# Patient Record
Sex: Male | Born: 1950 | Race: White | Hispanic: No | Marital: Single | Smoking: Former smoker
Health system: Southern US, Community
[De-identification: ages and names within clinical notes are randomized; demographics above are authoritative.]

## PROBLEM LIST (undated history)

## (undated) DIAGNOSIS — F32A Depression, unspecified: Secondary | ICD-10-CM

## (undated) DIAGNOSIS — F329 Major depressive disorder, single episode, unspecified: Secondary | ICD-10-CM

## (undated) DIAGNOSIS — M1990 Unspecified osteoarthritis, unspecified site: Secondary | ICD-10-CM

## (undated) DIAGNOSIS — R011 Cardiac murmur, unspecified: Secondary | ICD-10-CM

## (undated) HISTORY — DX: Cardiac murmur, unspecified: R01.1

## (undated) HISTORY — PX: NO PAST SURGERIES: SHX2092

---
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
## 2018-02-03 ENCOUNTER — Encounter: Payer: Self-pay | Admitting: *Deleted

## 2018-10-17 ENCOUNTER — Encounter
Admission: RE | Admit: 2018-10-17 | Discharge: 2018-10-17 | Disposition: A | Discharge status: Home / Self Care | Payer: Medicare Other | Source: Ambulatory Visit | Attending: Orthopedic Surgery | Admitting: Orthopedic Surgery

## 2018-10-17 ENCOUNTER — Other Ambulatory Visit: Payer: Self-pay

## 2018-10-17 DIAGNOSIS — Z1159 Encounter for screening for other viral diseases: Secondary | ICD-10-CM | POA: Diagnosis not present

## 2018-10-17 DIAGNOSIS — Z01818 Encounter for other preprocedural examination: Secondary | ICD-10-CM | POA: Insufficient documentation

## 2018-10-17 DIAGNOSIS — Z01810 Encounter for preprocedural cardiovascular examination: Secondary | ICD-10-CM | POA: Diagnosis not present

## 2018-10-17 HISTORY — DX: Unspecified osteoarthritis, unspecified site: M19.90

## 2018-10-17 HISTORY — DX: Depression, unspecified: F32.A

## 2018-10-17 LAB — TYPE AND SCREEN
ABO/RH(D): A NEG
Antibody Screen: NEGATIVE

## 2018-10-17 LAB — URINALYSIS, ROUTINE W REFLEX MICROSCOPIC
Bilirubin Urine: NEGATIVE
Glucose, UA: NEGATIVE mg/dL
Hgb urine dipstick: NEGATIVE
Ketones, ur: NEGATIVE mg/dL
Nitrite: POSITIVE — AB
Protein, ur: NEGATIVE mg/dL
Specific Gravity, Urine: 1.027 (ref 1.005–1.030)
Squamous Epithelial / HPF: NONE SEEN (ref 0–5)
pH: 5 (ref 5.0–8.0)

## 2018-10-17 LAB — BASIC METABOLIC PANEL
Anion gap: 8 (ref 5–15)
BUN: 21 mg/dL (ref 8–23)
CO2: 23 mmol/L (ref 22–32)
Calcium: 9.3 mg/dL (ref 8.9–10.3)
Chloride: 107 mmol/L (ref 98–111)
Creatinine, Ser: 0.74 mg/dL (ref 0.61–1.24)
GFR calc Af Amer: >60 mL/min (ref >60)
GFR calc non Af Amer: >60 mL/min (ref >60)
Glucose, Bld: 108 mg/dL — ABNORMAL HIGH (ref 70–99)
Potassium: 4 mmol/L (ref 3.5–5.1)
Sodium: 138 mmol/L (ref 135–145)

## 2018-10-17 LAB — CBC
HCT: 46.9 % (ref 39.0–52.0)
Hemoglobin: 15.6 g/dL (ref 13.0–17.0)
MCH: 31 pg (ref 26.0–34.0)
MCHC: 33.3 g/dL (ref 30.0–36.0)
MCV: 93.2 fL (ref 80.0–100.0)
Platelets: 204 10*3/uL (ref 150–400)
RBC: 5.03 MIL/uL (ref 4.22–5.81)
RDW: 12 % (ref 11.5–15.5)
WBC: 5.9 10*3/uL (ref 4.0–10.5)
nRBC: 0 % (ref 0.0–0.2)

## 2018-10-17 LAB — SURGICAL PCR SCREEN
MRSA, PCR: NEGATIVE
Staphylococcus aureus: NEGATIVE

## 2018-10-17 LAB — PROTIME-INR
INR: 1.1 (ref 0.8–1.2)
Prothrombin Time: 14 seconds (ref 11.4–15.2)

## 2018-10-17 LAB — SEDIMENTATION RATE: Sed Rate: 2 mm/hr (ref 0–20)

## 2018-10-17 LAB — APTT: aPTT: 31 seconds (ref 24–36)

## 2018-10-17 NOTE — Patient Instructions (Signed)
Your procedure is scheduled ZO:XWRUEon:Thurs 6/11 Report to Day Surgery. To find out your arrival time please call [phone] between 1PM - 3PM on Wed. 6/10.  Remember: Instructions that are not followed completely may result in serious medical risk,  up to and including death, or upon the discretion of your surgeon and anesthesiologist your  surgery may need to be rescheduled.     _X__ 1. Do not eat food after midnight the night before your procedure.                 No gum chewing or hard candies. You may drink clear liquids up to 2 hours                 before you are scheduled to arrive for your surgery- DO not drink clear                 liquids within 2 hours of the start of your surgery.                 Clear Liquids include:  water, apple juice without pulp, clear carbohydrate                 drink such as Clearfast of Gatorade, Black Coffee or Tea (Do not add                 anything to coffee or tea).  __X__2.  On the morning of surgery brush your teeth with toothpaste and water, you                may rinse your mouth with mouthwash if you wish.  Do not swallow any toothpaste of mouthwash.     _X__ 3.  No Alcohol for 24 hours before or after surgery.   ___ 4.  Do Not Smoke or use e-cigarettes For 24 Hours Prior to Your Surgery.                 Do not use any chewable tobacco products for at least 6 hours prior to                 surgery.  ____  5.  Bring all medications with you on the day of surgery if instructed.   _x___  6.  Notify your doctor if there is any change in your medical condition      (cold, fever, infections).     Do not wear jewelry, make-up, hairpins, clips or nail polish. Do not wear lotions, powders, or perfumes. You may wear deodorant. Do not shave 48 hours prior to surgery. Men may shave face and neck. Do not bring valuables to the hospital.    [facility] not [facility] any belongings or valuables.  Contacts, dentures  or bridgework may not be worn into surgery. Leave your suitcase in the car. After surgery it may be brought to your room. For patients admitted to the hospital, discharge time is determined by your treatment team.   Patients discharged the day of surgery will not be allowed to drive home.   Please read over the following fact sheets that you were given:    __x__ Take these medicines the morning of surgery with A SIP OF WATER:    1. none  2.   3.   4.  5.  6.  ____ Fleet Enema (as directed)   _x___ Use CHG Soap as directed  ____ Use inhalers on the day of surgery  ____ Stop metformin 2 days prior to surgery    ____ Take 1/2 of usual insulin dose the night before surgery. No insulin the morning          of surgery.   ____ Stop Coumadin/Plavix/aspirin on   __x__ Stop Anti-inflammatories today Ibuprofen   May take Tylenol   ____ Stop supplements until after surgery.    ____ Bring C-Pap to the hospital.

## 2018-10-18 LAB — NOVEL CORONAVIRUS, NAA (HOSP ORDER, SEND-OUT TO REF LAB; TAT 18-24 HRS): SARS-CoV-2, NAA: NOT DETECTED

## 2018-10-19 LAB — URINE CULTURE
Culture: >=100000 — AB
Special Requests: NORMAL

## 2018-10-19 MED ORDER — CEFAZOLIN SODIUM-DEXTROSE 2-4 GM/100ML-% IV SOLN: 2.0000 g | Freq: Once | INTRAVENOUS | Status: DC

## 2018-10-20 ENCOUNTER — Inpatient Hospital Stay: Payer: Medicare Other | Admitting: Certified Registered"

## 2018-10-20 ENCOUNTER — Other Ambulatory Visit: Payer: Self-pay

## 2018-10-20 ENCOUNTER — Encounter: Payer: Self-pay | Admitting: *Deleted

## 2018-10-20 ENCOUNTER — Ambulatory Visit
Admission: RE | Admit: 2018-10-20 | Discharge: 2018-10-20 | Disposition: A | Discharge status: Home / Self Care | Payer: Medicare Other | Source: Ambulatory Visit | Attending: Orthopedic Surgery | Admitting: Orthopedic Surgery

## 2018-10-20 DIAGNOSIS — M1612 Unilateral primary osteoarthritis, left hip: Secondary | ICD-10-CM | POA: Diagnosis not present

## 2018-10-20 DIAGNOSIS — Z419 Encounter for procedure for purposes other than remedying health state, unspecified: Secondary | ICD-10-CM

## 2018-10-20 DIAGNOSIS — Z87891 Personal history of nicotine dependence: Secondary | ICD-10-CM | POA: Insufficient documentation

## 2018-10-20 DIAGNOSIS — Z539 Procedure and treatment not carried out, unspecified reason: Secondary | ICD-10-CM | POA: Diagnosis not present

## 2018-10-20 LAB — ABO/RH: ABO/RH(D): A NEG

## 2018-10-20 MED ORDER — LIDOCAINE HCL (PF) 2 % IJ SOLN
INTRAMUSCULAR | Status: AC
  Filled 2018-10-20: qty 10

## 2018-10-20 MED ORDER — MIDAZOLAM HCL 2 MG/2ML IJ SOLN
INTRAMUSCULAR | Status: AC
  Filled 2018-10-20: qty 2

## 2018-10-20 MED ORDER — FAMOTIDINE 20 MG PO TABS
ORAL_TABLET | ORAL | Status: AC
  Filled 2018-10-20: qty 1

## 2018-10-20 MED ORDER — PROPOFOL 10 MG/ML IV BOLUS
INTRAVENOUS | Status: AC
  Filled 2018-10-20: qty 20

## 2018-10-20 MED ORDER — GLYCOPYRROLATE 0.2 MG/ML IJ SOLN
INTRAMUSCULAR | Status: AC
  Filled 2018-10-20: qty 1

## 2018-10-20 MED ORDER — FENTANYL CITRATE (PF) 100 MCG/2ML IJ SOLN
INTRAMUSCULAR | Status: AC
  Filled 2018-10-20: qty 2

## 2018-10-20 MED ORDER — CEFAZOLIN SODIUM-DEXTROSE 2-4 GM/100ML-% IV SOLN
INTRAVENOUS | Status: AC
  Filled 2018-10-20: qty 100

## 2018-10-20 MED ORDER — LACTATED RINGERS IV SOLN
INTRAVENOUS | Status: DC
  Administered 2018-10-20: 09:00:00 via INTRAVENOUS

## 2018-10-20 MED ORDER — FAMOTIDINE 20 MG PO TABS
20.0000 mg | ORAL_TABLET | Freq: Once | ORAL | Status: AC
  Administered 2018-10-20: 20 mg via ORAL

## 2018-10-20 NOTE — Anesthesia Preprocedure Evaluation (Addendum)
Anesthesia Evaluation  Patient identified by MRN, date of birth, ID band Patient awake    Reviewed: Allergy & Precautions, H&P , NPO status , Patient's Chart, lab work & pertinent test results  Airway        Dental   Pulmonary neg shortness of breath, neg COPD, neg recent URI, former smoker,           Cardiovascular (-) angina(-) Past MI and (-) Cardiac Stents negative cardio ROS  (-) dysrhythmias      Neuro/Psych PSYCHIATRIC DISORDERS Depression negative neurological ROS     GI/Hepatic negative GI ROS, Neg liver ROS,   Endo/Other  negative endocrine ROS  Renal/GU      Musculoskeletal   Abdominal   Peds  Hematology negative hematology ROS (+)   Anesthesia Other Findings Obese  Past Medical History: No date: Arthritis No date: Depression  Past Surgical History: No date: NO PAST SURGERIES     Reproductive/Obstetrics negative OB ROS                           Anesthesia Physical Anesthesia Plan  ASA: II  Anesthesia Plan: Spinal   Post-op Pain Management:    Induction:   PONV Risk Score and Plan: Propofol infusion  Airway Management Planned: Simple Face Mask and Natural Airway  Additional Equipment:   Intra-op Plan:   Post-operative Plan:   Informed Consent: I have reviewed the patients History and Physical, chart, labs and discussed the procedure including the risks, benefits and alternatives for the proposed anesthesia with the patient or authorized representative who has indicated his/her understanding and acceptance.     Dental Advisory Given  Plan Discussed with: Anesthesiologist and CRNA  Anesthesia Plan Comments:        Anesthesia Quick Evaluation

## 2018-10-20 NOTE — Progress Notes (Signed)
Dr. Rudene Christians at bedside to examine; Dr. Rudene Christians in agreement to cancel surgery d/t reddened area is at the site of incision.  Patient with verbal agreement to return for surgery at the selected date and time given to him from [facility] orthopedics.

## 2018-10-20 NOTE — OR Nursing (Signed)
Patient has red raised bump, appears to possibly be a bug bite, one inch in diameter.  OR nurse and Dr Rudene Christians notified. He will examine.

## 2018-10-24 ENCOUNTER — Other Ambulatory Visit
Admission: RE | Admit: 2018-10-24 | Discharge: 2018-10-24 | Disposition: A | Discharge status: Home / Self Care | Payer: Medicare Other | Source: Ambulatory Visit | Attending: Orthopedic Surgery | Admitting: Orthopedic Surgery

## 2018-10-24 DIAGNOSIS — Z1159 Encounter for screening for other viral diseases: Secondary | ICD-10-CM | POA: Insufficient documentation

## 2018-10-25 LAB — NOVEL CORONAVIRUS, NAA (HOSP ORDER, SEND-OUT TO REF LAB; TAT 18-24 HRS): SARS-CoV-2, NAA: NOT DETECTED

## 2018-10-27 ENCOUNTER — Encounter
Admission: RE | Disposition: A | Discharge status: Home Health | Payer: Self-pay | Source: Home / Self Care | Attending: Orthopedic Surgery

## 2018-10-27 ENCOUNTER — Inpatient Hospital Stay: Payer: Medicare Other | Admitting: Certified Registered"

## 2018-10-27 ENCOUNTER — Inpatient Hospital Stay: Payer: Medicare Other

## 2018-10-27 ENCOUNTER — Inpatient Hospital Stay: Admit: 2018-10-27 | Payer: Medicare Other | Admitting: Orthopedic Surgery

## 2018-10-27 ENCOUNTER — Encounter: Payer: Self-pay | Admitting: *Deleted

## 2018-10-27 ENCOUNTER — Other Ambulatory Visit: Payer: Self-pay

## 2018-10-27 ENCOUNTER — Inpatient Hospital Stay
Admission: RE | Admit: 2018-10-27 | Discharge: 2018-10-29 | DRG: 470 | Disposition: A | Discharge status: Home Health | Payer: Medicare Other | Attending: Surgery | Admitting: Surgery

## 2018-10-27 DIAGNOSIS — Z87891 Personal history of nicotine dependence: Secondary | ICD-10-CM

## 2018-10-27 DIAGNOSIS — Z8249 Family history of ischemic heart disease and other diseases of the circulatory system: Secondary | ICD-10-CM | POA: Diagnosis not present

## 2018-10-27 DIAGNOSIS — Z6835 Body mass index (BMI) 35.0-35.9, adult: Secondary | ICD-10-CM | POA: Diagnosis not present

## 2018-10-27 DIAGNOSIS — R000 Tachycardia, unspecified: Secondary | ICD-10-CM | POA: Diagnosis not present

## 2018-10-27 DIAGNOSIS — Z8261 Family history of arthritis: Secondary | ICD-10-CM | POA: Diagnosis not present

## 2018-10-27 DIAGNOSIS — Z419 Encounter for procedure for purposes other than remedying health state, unspecified: Secondary | ICD-10-CM

## 2018-10-27 DIAGNOSIS — Z79899 Other long term (current) drug therapy: Secondary | ICD-10-CM

## 2018-10-27 DIAGNOSIS — M1612 Unilateral primary osteoarthritis, left hip: Secondary | ICD-10-CM | POA: Diagnosis present

## 2018-10-27 DIAGNOSIS — E669 Obesity, unspecified: Secondary | ICD-10-CM | POA: Diagnosis present

## 2018-10-27 DIAGNOSIS — Z96642 Presence of left artificial hip joint: Secondary | ICD-10-CM

## 2018-10-27 DIAGNOSIS — G8918 Other acute postprocedural pain: Secondary | ICD-10-CM

## 2018-10-27 HISTORY — PX: TOTAL HIP ARTHROPLASTY: SHX124

## 2018-10-27 LAB — CREATININE, SERUM
Creatinine, Ser: 0.8 mg/dL (ref 0.61–1.24)
GFR calc Af Amer: >60 mL/min (ref >60)
GFR calc non Af Amer: >60 mL/min (ref >60)

## 2018-10-27 LAB — CBC
HCT: 43.1 % (ref 39.0–52.0)
Hemoglobin: 14.2 g/dL (ref 13.0–17.0)
MCH: 31.4 pg (ref 26.0–34.0)
MCHC: 32.9 g/dL (ref 30.0–36.0)
MCV: 95.4 fL (ref 80.0–100.0)
Platelets: 183 10*3/uL (ref 150–400)
RBC: 4.52 MIL/uL (ref 4.22–5.81)
RDW: 11.9 % (ref 11.5–15.5)
WBC: 8.1 10*3/uL (ref 4.0–10.5)
nRBC: 0 % (ref 0.0–0.2)

## 2018-10-27 SURGERY — ARTHROPLASTY, HIP, TOTAL, ANTERIOR APPROACH
Anesthesia: Spinal | Laterality: Left

## 2018-10-27 SURGERY — ARTHROPLASTY, HIP, TOTAL,POSTERIOR APPROACH
Anesthesia: Spinal | Laterality: Left

## 2018-10-27 MED ORDER — MORPHINE SULFATE (PF) 2 MG/ML IV SOLN: 0.5000 mg | INTRAVENOUS | Status: DC | PRN

## 2018-10-27 MED ORDER — ALUM & MAG HYDROXIDE-SIMETH 200-200-20 MG/5ML PO SUSP: 30.0000 mL | ORAL | Status: DC | PRN

## 2018-10-27 MED ORDER — MIDAZOLAM HCL 5 MG/5ML IJ SOLN
INTRAMUSCULAR | Status: DC | PRN
  Administered 2018-10-27: 2 mg via INTRAVENOUS

## 2018-10-27 MED ORDER — NEOMYCIN-POLYMYXIN B GU 40-200000 IR SOLN
Status: DC | PRN
  Administered 2018-10-27: 4 mL

## 2018-10-27 MED ORDER — SODIUM CHLORIDE FLUSH 0.9 % IV SOLN
INTRAVENOUS | Status: AC
  Filled 2018-10-27: qty 60

## 2018-10-27 MED ORDER — METHOCARBAMOL 1000 MG/10ML IJ SOLN
500.0000 mg | Freq: Four times a day (QID) | INTRAVENOUS | Status: DC | PRN
  Filled 2018-10-27: qty 5

## 2018-10-27 MED ORDER — METOCLOPRAMIDE HCL 5 MG/ML IJ SOLN: 5.0000 mg | Freq: Three times a day (TID) | INTRAMUSCULAR | Status: DC | PRN

## 2018-10-27 MED ORDER — MENTHOL 3 MG MT LOZG: 1.0000 | LOZENGE | OROMUCOSAL | Status: DC | PRN

## 2018-10-27 MED ORDER — MAGNESIUM HYDROXIDE 400 MG/5ML PO SUSP: 30.0000 mL | Freq: Every day | ORAL | Status: DC | PRN

## 2018-10-27 MED ORDER — FENTANYL CITRATE (PF) 100 MCG/2ML IJ SOLN: 25.0000 ug | INTRAMUSCULAR | Status: DC | PRN

## 2018-10-27 MED ORDER — SODIUM CHLORIDE 0.9 % IV SOLN
INTRAVENOUS | Status: DC | PRN
  Administered 2018-10-27: 09:00:00 60 mL

## 2018-10-27 MED ORDER — CEFAZOLIN SODIUM-DEXTROSE 2-4 GM/100ML-% IV SOLN
INTRAVENOUS | Status: AC
  Filled 2018-10-27: qty 100

## 2018-10-27 MED ORDER — FAMOTIDINE 20 MG PO TABS
20.0000 mg | ORAL_TABLET | Freq: Once | ORAL | Status: AC
  Administered 2018-10-27: 07:00:00 20 mg via ORAL

## 2018-10-27 MED ORDER — TRANEXAMIC ACID 1000 MG/10ML IV SOLN
INTRAVENOUS | Status: AC
  Filled 2018-10-27: qty 10

## 2018-10-27 MED ORDER — DOCUSATE SODIUM 100 MG PO CAPS
100.0000 mg | ORAL_CAPSULE | Freq: Two times a day (BID) | ORAL | Status: DC
  Administered 2018-10-27 – 2018-10-29 (×4): 100 mg via ORAL
  Filled 2018-10-27 (×4): qty 1

## 2018-10-27 MED ORDER — PROMETHAZINE HCL 25 MG/ML IJ SOLN: 6.2500 mg | INTRAMUSCULAR | Status: DC | PRN

## 2018-10-27 MED ORDER — FENTANYL CITRATE (PF) 100 MCG/2ML IJ SOLN
INTRAMUSCULAR | Status: AC
  Filled 2018-10-27: qty 2

## 2018-10-27 MED ORDER — CEFAZOLIN SODIUM-DEXTROSE 2-4 GM/100ML-% IV SOLN
2.0000 g | Freq: Four times a day (QID) | INTRAVENOUS | Status: AC
  Administered 2018-10-27 (×2): 2 g via INTRAVENOUS
  Filled 2018-10-27 (×2): qty 100

## 2018-10-27 MED ORDER — NEOMYCIN-POLYMYXIN B GU 40-200000 IR SOLN
Status: AC
  Filled 2018-10-27: qty 1

## 2018-10-27 MED ORDER — PANTOPRAZOLE SODIUM 40 MG PO TBEC
40.0000 mg | DELAYED_RELEASE_TABLET | Freq: Every day | ORAL | Status: DC
  Administered 2018-10-28 – 2018-10-29 (×2): 40 mg via ORAL
  Filled 2018-10-27 (×2): qty 1

## 2018-10-27 MED ORDER — FENTANYL CITRATE (PF) 100 MCG/2ML IJ SOLN
INTRAMUSCULAR | Status: DC | PRN
  Administered 2018-10-27 (×2): 50 ug via INTRAVENOUS

## 2018-10-27 MED ORDER — MAGNESIUM CITRATE PO SOLN: 1.0000 | Freq: Once | ORAL | Status: DC | PRN

## 2018-10-27 MED ORDER — METOCLOPRAMIDE HCL 10 MG PO TABS: 5.0000 mg | ORAL_TABLET | Freq: Three times a day (TID) | ORAL | Status: DC | PRN

## 2018-10-27 MED ORDER — BISACODYL 5 MG PO TBEC: 5.0000 mg | DELAYED_RELEASE_TABLET | Freq: Every day | ORAL | Status: DC | PRN

## 2018-10-27 MED ORDER — PROPOFOL 10 MG/ML IV BOLUS
INTRAVENOUS | Status: AC
  Filled 2018-10-27: qty 40

## 2018-10-27 MED ORDER — DIPHENHYDRAMINE HCL 12.5 MG/5ML PO ELIX: 12.5000 mg | ORAL_SOLUTION | ORAL | Status: DC | PRN

## 2018-10-27 MED ORDER — LACTATED RINGERS IV SOLN
INTRAVENOUS | Status: DC
  Administered 2018-10-27: 07:00:00 via INTRAVENOUS

## 2018-10-27 MED ORDER — ONDANSETRON HCL 4 MG PO TABS: 4.0000 mg | ORAL_TABLET | Freq: Four times a day (QID) | ORAL | Status: DC | PRN

## 2018-10-27 MED ORDER — ENOXAPARIN SODIUM 40 MG/0.4ML ~~LOC~~ SOLN
40.0000 mg | SUBCUTANEOUS | Status: DC
  Administered 2018-10-28 – 2018-10-29 (×2): 40 mg via SUBCUTANEOUS
  Filled 2018-10-27 (×2): qty 0.4

## 2018-10-27 MED ORDER — GABAPENTIN 300 MG PO CAPS
300.0000 mg | ORAL_CAPSULE | Freq: Three times a day (TID) | ORAL | Status: DC
  Administered 2018-10-27 – 2018-10-29 (×6): 300 mg via ORAL
  Filled 2018-10-27 (×6): qty 1

## 2018-10-27 MED ORDER — PHENOL 1.4 % MT LIQD: 1.0000 | OROMUCOSAL | Status: DC | PRN

## 2018-10-27 MED ORDER — HYDROCODONE-ACETAMINOPHEN 7.5-325 MG PO TABS: 1.0000 | ORAL_TABLET | ORAL | Status: DC | PRN

## 2018-10-27 MED ORDER — TRAMADOL HCL 50 MG PO TABS
50.0000 mg | ORAL_TABLET | Freq: Four times a day (QID) | ORAL | Status: DC
  Administered 2018-10-27 – 2018-10-29 (×6): 50 mg via ORAL
  Filled 2018-10-27 (×7): qty 1

## 2018-10-27 MED ORDER — METHOCARBAMOL 500 MG PO TABS: 500.0000 mg | ORAL_TABLET | Freq: Four times a day (QID) | ORAL | Status: DC | PRN

## 2018-10-27 MED ORDER — PROPOFOL 500 MG/50ML IV EMUL
INTRAVENOUS | Status: DC | PRN
  Administered 2018-10-27: 75 ug/kg/min via INTRAVENOUS

## 2018-10-27 MED ORDER — ONDANSETRON HCL 4 MG/2ML IJ SOLN: 4.0000 mg | Freq: Four times a day (QID) | INTRAMUSCULAR | Status: DC | PRN

## 2018-10-27 MED ORDER — ZOLPIDEM TARTRATE 5 MG PO TABS: 5.0000 mg | ORAL_TABLET | Freq: Every evening | ORAL | Status: DC | PRN

## 2018-10-27 MED ORDER — ACETAMINOPHEN 325 MG PO TABS: 325.0000 mg | ORAL_TABLET | Freq: Four times a day (QID) | ORAL | Status: DC | PRN

## 2018-10-27 MED ORDER — ACETAMINOPHEN 500 MG PO TABS
500.0000 mg | ORAL_TABLET | Freq: Four times a day (QID) | ORAL | Status: AC
  Administered 2018-10-27 – 2018-10-28 (×4): 500 mg via ORAL
  Filled 2018-10-27 (×4): qty 1

## 2018-10-27 MED ORDER — PROPOFOL 10 MG/ML IV BOLUS
INTRAVENOUS | Status: DC | PRN
  Administered 2018-10-27: 30 mg via INTRAVENOUS

## 2018-10-27 MED ORDER — BUPIVACAINE-EPINEPHRINE (PF) 0.25% -1:200000 IJ SOLN
INTRAMUSCULAR | Status: AC
  Filled 2018-10-27: qty 30

## 2018-10-27 MED ORDER — MIDAZOLAM HCL 2 MG/2ML IJ SOLN
INTRAMUSCULAR | Status: AC
  Filled 2018-10-27: qty 2

## 2018-10-27 MED ORDER — SODIUM CHLORIDE 0.9 % IV SOLN
INTRAVENOUS | Status: DC
  Administered 2018-10-27 – 2018-10-28 (×3): via INTRAVENOUS

## 2018-10-27 MED ORDER — EPHEDRINE SULFATE 50 MG/ML IJ SOLN
INTRAMUSCULAR | Status: DC | PRN
  Administered 2018-10-27 (×2): 10 mg via INTRAVENOUS

## 2018-10-27 MED ORDER — FAMOTIDINE 20 MG PO TABS
ORAL_TABLET | ORAL | Status: AC
  Administered 2018-10-27: 20 mg via ORAL
  Filled 2018-10-27: qty 1

## 2018-10-27 MED ORDER — BUPIVACAINE-EPINEPHRINE 0.25% -1:200000 IJ SOLN
INTRAMUSCULAR | Status: DC | PRN
  Administered 2018-10-27: 30 mL

## 2018-10-27 MED ORDER — BUPIVACAINE LIPOSOME 1.3 % IJ SUSP
INTRAMUSCULAR | Status: AC
  Filled 2018-10-27: qty 20

## 2018-10-27 MED ORDER — HYDROCODONE-ACETAMINOPHEN 5-325 MG PO TABS
1.0000 | ORAL_TABLET | ORAL | Status: DC | PRN
  Administered 2018-10-27 – 2018-10-28 (×3): 1 via ORAL
  Administered 2018-10-28: 2 via ORAL
  Filled 2018-10-27 (×3): qty 1
  Filled 2018-10-27: qty 2

## 2018-10-27 MED ORDER — CEFAZOLIN SODIUM-DEXTROSE 2-4 GM/100ML-% IV SOLN
2.0000 g | Freq: Once | INTRAVENOUS | Status: AC
  Administered 2018-10-27: 2 g via INTRAVENOUS

## 2018-10-27 SURGICAL SUPPLY — 55 items
BLADE SAGITTAL AGGR TOOTH XLG (BLADE) ×2 IMPLANT
BNDG COHESIVE 6X5 TAN STRL LF (GAUZE/BANDAGES/DRESSINGS) ×6 IMPLANT
CANISTER SUCT 1200ML W/VALVE (MISCELLANEOUS) ×2 IMPLANT
CANISTER WOUND CARE 500ML ATS (WOUND CARE) ×2 IMPLANT
CHLORAPREP W/TINT 26 (MISCELLANEOUS) ×2 IMPLANT
COVER WAND RF STERILE (DRAPES) ×2 IMPLANT
DRAPE C-ARM XRAY 36X54 (DRAPES) ×2 IMPLANT
DRAPE INCISE IOBAN 66X60 STRL (DRAPES) IMPLANT
DRAPE POUCH INSTRU U-SHP 10X18 (DRAPES) ×2 IMPLANT
DRAPE SHEET LG 3/4 BI-LAMINATE (DRAPES) ×6 IMPLANT
DRAPE TABLE BACK 80X90 (DRAPES) ×2 IMPLANT
DRESSING SURGICEL FIBRLLR 1X2 (HEMOSTASIS) ×2 IMPLANT
DRSG OPSITE POSTOP 4X8 (GAUZE/BANDAGES/DRESSINGS) ×4 IMPLANT
DRSG SURGICEL FIBRILLAR 1X2 (HEMOSTASIS) ×4
ELECT BLADE 6.5 EXT (BLADE) ×2 IMPLANT
ELECT REM PT RETURN 9FT ADLT (ELECTROSURGICAL) ×2
ELECTRODE REM PT RTRN 9FT ADLT (ELECTROSURGICAL) ×1 IMPLANT
GLOVE BIOGEL PI IND STRL 9 (GLOVE) ×1 IMPLANT
GLOVE BIOGEL PI INDICATOR 9 (GLOVE) ×1
GLOVE SURG SYN 9.0  PF PI (GLOVE) ×2
GLOVE SURG SYN 9.0 PF PI (GLOVE) ×2 IMPLANT
GOWN SRG 2XL LVL 4 RGLN SLV (GOWNS) ×1 IMPLANT
GOWN STRL NON-REIN 2XL LVL4 (GOWNS) ×1
GOWN STRL REUS W/ TWL LRG LVL3 (GOWN DISPOSABLE) ×1 IMPLANT
GOWN STRL REUS W/TWL LRG LVL3 (GOWN DISPOSABLE) ×1
HEMOVAC 400CC 10FR (MISCELLANEOUS) IMPLANT
HOLDER FOLEY CATH W/STRAP (MISCELLANEOUS) ×2 IMPLANT
HOOD PEEL AWAY FLYTE STAYCOOL (MISCELLANEOUS) ×2 IMPLANT
KIT PREVENA INCISION MGT 13 (CANNISTER) ×2 IMPLANT
MAT ABSORB  FLUID 56X50 GRAY (MISCELLANEOUS) ×1
MAT ABSORB FLUID 56X50 GRAY (MISCELLANEOUS) ×1 IMPLANT
NDL SAFETY ECLIPSE 18X1.5 (NEEDLE) ×1 IMPLANT
NEEDLE HYPO 18GX1.5 SHARP (NEEDLE) ×1
NEEDLE SPNL 20GX3.5 QUINCKE YW (NEEDLE) ×4 IMPLANT
NS IRRIG 1000ML POUR BTL (IV SOLUTION) ×2 IMPLANT
PACK HIP COMPR (MISCELLANEOUS) ×2 IMPLANT
SCALPEL PROTECTED #10 DISP (BLADE) ×4 IMPLANT
SOL PREP PVP 2OZ (MISCELLANEOUS) ×2
SOLUTION PREP PVP 2OZ (MISCELLANEOUS) ×1 IMPLANT
SPONGE DRAIN TRACH 4X4 STRL 2S (GAUZE/BANDAGES/DRESSINGS) ×2 IMPLANT
STAPLER SKIN PROX 35W (STAPLE) ×2 IMPLANT
STRAP SAFETY 5IN WIDE (MISCELLANEOUS) ×2 IMPLANT
SUT DVC 2 QUILL PDO  T11 36X36 (SUTURE) ×1
SUT DVC 2 QUILL PDO T11 36X36 (SUTURE) ×1 IMPLANT
SUT SILK 0 (SUTURE) ×1
SUT SILK 0 30XBRD TIE 6 (SUTURE) ×1 IMPLANT
SUT V-LOC 90 ABS DVC 3-0 CL (SUTURE) ×2 IMPLANT
SUT VIC AB 1 CT1 36 (SUTURE) ×2 IMPLANT
SYR 20CC LL (SYRINGE) ×2 IMPLANT
SYR 30ML LL (SYRINGE) ×2 IMPLANT
SYR 50ML LL SCALE MARK (SYRINGE) ×4 IMPLANT
SYR BULB IRRIG 60ML STRL (SYRINGE) ×2 IMPLANT
TAPE MICROFOAM 4IN (TAPE) ×2 IMPLANT
TOWEL OR 17X26 4PK STRL BLUE (TOWEL DISPOSABLE) ×2 IMPLANT
TRAY FOLEY MTR SLVR 16FR STAT (SET/KITS/TRAYS/PACK) ×2 IMPLANT

## 2018-10-27 SURGICAL SUPPLY — 59 items
BLADE SAGITTAL AGGR TOOTH XLG (BLADE) ×2 IMPLANT
BNDG COHESIVE 6X5 TAN STRL LF (GAUZE/BANDAGES/DRESSINGS) ×4 IMPLANT
CANISTER SUCT 1200ML W/VALVE (MISCELLANEOUS) ×2 IMPLANT
CANISTER WOUND CARE 500ML ATS (WOUND CARE) ×2 IMPLANT
CHLORAPREP W/TINT 26 (MISCELLANEOUS) ×2 IMPLANT
COVER WAND RF STERILE (DRAPES) ×2 IMPLANT
DRAPE C-ARM XRAY 36X54 (DRAPES) ×2 IMPLANT
DRAPE INCISE IOBAN 66X60 STRL (DRAPES) IMPLANT
DRAPE POUCH INSTRU U-SHP 10X18 (DRAPES) ×2 IMPLANT
DRAPE SHEET LG 3/4 BI-LAMINATE (DRAPES) ×6 IMPLANT
DRAPE TABLE BACK 80X90 (DRAPES) ×2 IMPLANT
DRESSING SURGICEL FIBRLLR 1X2 (HEMOSTASIS) ×2 IMPLANT
DRSG OPSITE POSTOP 4X8 (GAUZE/BANDAGES/DRESSINGS) ×4 IMPLANT
DRSG SURGICEL FIBRILLAR 1X2 (HEMOSTASIS) ×4
ELECT BLADE 6.5 EXT (BLADE) ×2 IMPLANT
ELECT REM PT RETURN 9FT ADLT (ELECTROSURGICAL) ×2
ELECTRODE REM PT RTRN 9FT ADLT (ELECTROSURGICAL) ×1 IMPLANT
GLOVE BIOGEL PI IND STRL 9 (GLOVE) ×1 IMPLANT
GLOVE BIOGEL PI INDICATOR 9 (GLOVE) ×1
GLOVE SURG SYN 9.0  PF PI (GLOVE) ×2
GLOVE SURG SYN 9.0 PF PI (GLOVE) ×2 IMPLANT
GOWN SRG 2XL LVL 4 RGLN SLV (GOWNS) ×1 IMPLANT
GOWN STRL NON-REIN 2XL LVL4 (GOWNS) ×1
GOWN STRL REUS W/ TWL LRG LVL3 (GOWN DISPOSABLE) ×1 IMPLANT
GOWN STRL REUS W/TWL LRG LVL3 (GOWN DISPOSABLE) ×1
HEMOVAC 400CC 10FR (MISCELLANEOUS) IMPLANT
HIP DBL LINER 54X28 (Liner) ×2 IMPLANT
HIP FEM HD L 28 (Head) ×2 IMPLANT
HOLDER FOLEY CATH W/STRAP (MISCELLANEOUS) ×2 IMPLANT
HOOD PEEL AWAY FLYTE STAYCOOL (MISCELLANEOUS) ×2 IMPLANT
KIT PREVENA INCISION MGT 13 (CANNISTER) ×2 IMPLANT
MAT ABSORB  FLUID 56X50 GRAY (MISCELLANEOUS) ×1
MAT ABSORB FLUID 56X50 GRAY (MISCELLANEOUS) ×1 IMPLANT
NDL SAFETY ECLIPSE 18X1.5 (NEEDLE) ×1 IMPLANT
NEEDLE HYPO 18GX1.5 SHARP (NEEDLE) ×1
NEEDLE SPNL 20GX3.5 QUINCKE YW (NEEDLE) ×4 IMPLANT
NS IRRIG 1000ML POUR BTL (IV SOLUTION) ×2 IMPLANT
PACK HIP COMPR (MISCELLANEOUS) ×2 IMPLANT
SCALPEL PROTECTED #10 DISP (BLADE) ×4 IMPLANT
SHELL ACETABULAR SZ 54 DM (Shell) ×2 IMPLANT
SOL PREP PVP 2OZ (MISCELLANEOUS) ×2
SOLUTION PREP PVP 2OZ (MISCELLANEOUS) ×1 IMPLANT
SPONGE DRAIN TRACH 4X4 STRL 2S (GAUZE/BANDAGES/DRESSINGS) ×2 IMPLANT
STAPLER SKIN PROX 35W (STAPLE) ×2 IMPLANT
STEM FEMORAL SZ3 LAT COLLARED (Stem) ×2 IMPLANT
STRAP SAFETY 5IN WIDE (MISCELLANEOUS) ×2 IMPLANT
SUT DVC 2 QUILL PDO  T11 36X36 (SUTURE) ×1
SUT DVC 2 QUILL PDO T11 36X36 (SUTURE) ×1 IMPLANT
SUT SILK 0 (SUTURE) ×1
SUT SILK 0 30XBRD TIE 6 (SUTURE) ×1 IMPLANT
SUT V-LOC 90 ABS DVC 3-0 CL (SUTURE) ×2 IMPLANT
SUT VIC AB 1 CT1 36 (SUTURE) ×2 IMPLANT
SYR 20CC LL (SYRINGE) ×2 IMPLANT
SYR 30ML LL (SYRINGE) ×2 IMPLANT
SYR 50ML LL SCALE MARK (SYRINGE) ×4 IMPLANT
SYR BULB IRRIG 60ML STRL (SYRINGE) ×2 IMPLANT
TAPE MICROFOAM 4IN (TAPE) ×2 IMPLANT
TOWEL OR 17X26 4PK STRL BLUE (TOWEL DISPOSABLE) ×2 IMPLANT
TRAY FOLEY MTR SLVR 16FR STAT (SET/KITS/TRAYS/PACK) ×2 IMPLANT

## 2018-10-27 NOTE — Anesthesia Post-op Follow-up Note (Signed)
Anesthesia QCDR form completed.        

## 2018-10-27 NOTE — Evaluation (Signed)
Physical Therapy Evaluation Patient Details Name: Hayden Rush MRN: [id number] DOB: 07-09-1950 Today's Date: 10/27/2018   History of Present Illness  Pt is a 68 year old male admitted s/p L anterior THA.  PMH includes depression and arthritis.  Clinical Impression  Pt is a 68 year old male who lives alone.  He will be staying with his brother upon discharge from the hospital.  Pt was generally active prior to surgery.  He is alert and oriented and receptive to education.  Pt reported some return of sensation in LE's and was able to demonstrate good strength of ankles and knees.  Pt performed bed mobility mod I and was able to sit at EOB independently.  He transferred to chair with education concerning use of RW, sequencing and assistance with management of equipment.  Pt reported pain increase in L hip over site of incision and RN notified.  Pt will continue to benefit from skilled PT with focus on strength, safe functional mobility, stair training and pain management.    Follow Up Recommendations Home health PT;Supervision - Intermittent    Equipment Recommendations  Rolling walker with 5" wheels    Recommendations for Other Services       Precautions / Restrictions Precautions Precautions: Fall Restrictions Weight Bearing Restrictions: Yes LLE Weight Bearing: Weight bearing as tolerated      Mobility  Bed Mobility Overal bed mobility: Modified Independent             General bed mobility comments: Increased time  Transfers Overall transfer level: Needs assistance Equipment used: Rolling walker (2 wheeled) Transfers: Stand Pivot Transfers   Stand pivot transfers: Min assist       General transfer comment: Pt able to rise from bedside without physical assistance; required VC's for use of RW and sequencing.  PT also adjusted RW for pt's height appropriately.  Ambulation/Gait Ambulation/Gait assistance: (deferred due to decreased LE sensation.)               Stairs            Wheelchair Mobility    Modified Rankin (Stroke Patients Only)       Balance Overall balance assessment: Needs assistance   Sitting balance-Leahy Scale: Good     Standing balance support: Bilateral upper extremity supported Standing balance-Leahy Scale: Fair Standing balance comment: Requires use of RW at this time                             Pertinent Vitals/Pain Pain Assessment: 0-10 Pain Score: 5  Pain Location: L hip Pain Descriptors / Indicators: Burning Pain Intervention(s): Limited activity within patient's tolerance;Monitored during session    [place] expects to be discharged to:: Private residence Living Arrangements: Other relatives(Will be staying at brother's house when discharged.) Available Help at Discharge: Family;Available PRN/intermittently Type of Home: House Home Access: Stairs to enter Entrance Stairs-Rails: Can reach both Entrance Stairs-Number of Steps: 4 Home Layout: One level Home Equipment: None      Prior Function Level of Independence: Independent         Comments: Pt played golf regularly before being limited by hip pain and 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     Hand Dominance        Extremity/Trunk Assessment   Upper Extremity Assessment Upper Extremity Assessment: Overall WFL for tasks assessed    Lower Extremity Assessment Lower Extremity Assessment: Overall [facility]  for tasks assessed(L LE mildly impaired due to pain and gradual return of sensation following surgery.)    Cervical / Trunk Assessment Cervical / Trunk Assessment: Normal  Communication   Communication: No difficulties  Cognition                                              General Comments      Exercises Total Joint Exercises Ankle Circles/Pumps: 10 reps;Both;Seated Quad Sets: Left;10 reps;Seated Hip ABduction/ADduction: Left;10  reps;Seated Other Exercises Other Exercises: Time to discuss what to expect with PT moving forward and time to answer pt's questions. x3 min   Assessment/Plan    PT Assessment Patient needs continued PT services  PT Problem List Decreased strength;Decreased mobility;Decreased activity tolerance;Decreased balance;Pain       PT Treatment Interventions Gait training;DME instruction;Therapeutic activities;Therapeutic exercise;Stair training;Balance training;Functional mobility training;Patient/family education    PT Goals (Current goals can be found in the Care Plan section)  Acute Rehab PT Goals Patient Stated Goal: To return to playing golf. PT Goal Formulation: With patient Time For Goal Achievement: 11/10/18 Potential to Achieve Goals: Good    Frequency Min 2X/week   Barriers to discharge        Co-evaluation               AM-PAC PT "6 Clicks" Mobility  Outcome Measure Help needed turning from your back to your side while in a flat bed without using bedrails?: None Help needed moving from lying on your back to sitting on the side of a flat bed without using bedrails?: A Little Help needed moving to and from a bed to a chair (including a wheelchair)?: A Little Help needed standing up from a chair using your arms (e.g., wheelchair or bedside chair)?: A Little Help needed to walk in hospital room?: A Little Help needed climbing 3-5 steps with a railing? : A Little 6 Click Score: 19    End of Session Equipment Utilized During Treatment: Gait belt Activity Tolerance: Patient tolerated treatment well Patient left: in chair;with chair alarm set;with call bell/phone within reach   PT Visit Diagnosis: Unsteadiness on feet (R26.81);Muscle weakness (generalized) (M62.81);Difficulty in walking, not elsewhere classified (R26.2);Pain Pain - Right/Left: Left Pain - part of body: Hip    Time: 1355-1430 PT Time Calculation (min) (ACUTE ONLY): 35 min   Charges:   PT  Evaluation $PT Eval Low Complexity: 1 Low PT Treatments $Therapeutic Activity: 8-22 mins       Glenetta HewSarah Shavanna Furnari, PT, DPT   Glenetta HewSarah Eddye Broxterman 10/27/2018, 2:43 PM

## 2018-10-27 NOTE — H&P (Signed)
Reviewed paper H+P, will be scanned into chart. No changes noted.  

## 2018-10-27 NOTE — Transfer of Care (Signed)
Immediate Anesthesia Transfer of Care Note  Patient: Hayden Rush  Procedure(s) Performed: TOTAL HIP ARTHROPLASTY ANTERIOR APPROACH (Left )  Patient Location: PACU  Anesthesia Type:Spinal  Level of Consciousness: awake, alert  and oriented  Airway & Oxygen Therapy: Patient Spontanous Breathing  Post-op Assessment: Report given to RN and Post -op Vital signs reviewed and stable  Post vital signs: Reviewed and stable  Last Vitals:  Vitals Value Taken Time  BP    Temp    Pulse    Resp    SpO2      Last Pain:  Vitals:   10/27/18 0634  TempSrc: Tympanic  PainSc: 0-No pain         Complications: No apparent anesthesia complications

## 2018-10-27 NOTE — Anesthesia Procedure Notes (Addendum)
Spinal  Patient location during procedure: OR Start time: 10/27/2018 7:30 AM End time: 10/27/2018 7:55 AM Staffing Anesthesiologist: Karenz, Andrew, MD Resident/CRNA: Disser, Andrew L, CRNA Performed: anesthesiologist and resident/CRNA  Preanesthetic Checklist Completed: patient identified, site marked, surgical consent, pre-op evaluation, timeout performed, IV checked, risks and benefits discussed and monitors and equipment checked Spinal Block Patient position: sitting Prep: ChloraPrep Patient monitoring: heart rate, cardiac monitor, continuous pulse ox and blood pressure Approach: midline Location: L4-5 Injection technique: single-shot Needle Needle type: Quincke  Needle gauge: 22 G Needle length: 9 cm Assessment Sensory level: T10 Additional Notes Patient and kit checked prior to beginning the procedure.  IV in place.  Clear CSF return prior to injection of medication.  Medication injected smoothly without complications.  Patient tolerated the procedure well.     

## 2018-10-27 NOTE — Op Note (Signed)
10/27/2018  9:38 AM  PATIENT:  Hayden Rush  68 y.o. male  PRE-OPERATIVE DIAGNOSIS:  primary loccalized osteoarthritis of left hip  POST-OPERATIVE DIAGNOSIS:  osteoarthritis left hip  PROCEDURE:  Procedure(s): TOTAL HIP ARTHROPLASTY ANTERIOR APPROACH (Left)  SURGEON: Laurene Footman, MD  ASSISTANTS: none  ANESTHESIA:   spinal  EBL:  Total I/O In: 800 [I.V.:800] Out: 50 [Blood:50]  BLOOD ADMINISTERED:none  DRAINS: none   LOCAL MEDICATIONS USED:  MARCAINE    and OTHER Exparel  SPECIMEN:  Source of Specimen:  Left femoral head  DISPOSITION OF SPECIMEN:  PATHOLOGY  COUNTS:  YES  TOURNIQUET:  * No tourniquets in log *  IMPLANTS: Medacta AMIS 3 lateralized stem,Mpact DM 54 mm cup and liner with L 28 mm metal head  DICTATION: .Dragon Dictation   The patient was brought to the operating room and after spinal anesthesia was obtained patient was placed on the operative table with the ipsilateral foot into the Medacta attachment, contralateral leg on a well-padded table. C-arm was brought in and preop template x-ray taken. After prepping and draping in usual sterile fashion appropriate patient identification and timeout procedures were completed. Anterior approach to the hip was obtained and centered over the greater trochanter and TFL muscle. The subcutaneous tissue was incised hemostasis being achieved by electrocautery. TFL fascia was incised and the muscle retracted laterally deep retractor placed. The lateral femoral circumflex vessels were identified and ligated. The anterior capsule was exposed and a capsulotomy performed. The neck was identified and a femoral neck cut carried out with a saw. The head was removed without difficulty and showed sclerotic femoral head and acetabulum. Reaming was carried out to 54 mm and a 54 mm cup trial gave appropriate tightness to the acetabular component a 54 DM cup was impacted into position. The leg was then externally rotated and ischiofemoral  and pubofemoral releases carried out. The femur was sequentially broached to a size 3, size 3 standard and lateralized with S and L heads trials were placed and the final components chosen. The 3 lateralized stem was inserted along with a metal L 28 mm head and 54 mm liner. The hip was reduced and was stable the wound was thoroughly irrigated with fibrillar placed along the posterior capsule and medial neck. The deep fascia ws closed using a heavy Quill after infiltration of 30 cc of quarter percent Sensorcaine with epinephrine.3-0 V-loc to close the skin with skin staples.  Incisional wound VAC applied, Exparel was infiltrated throughout the case.  Patient was sent to recovery in stable condition.   PLAN OF CARE: Admit to inpatient

## 2018-10-27 NOTE — Anesthesia Preprocedure Evaluation (Signed)
Anesthesia Evaluation  Patient identified by MRN, date of birth, ID band Patient awake    Reviewed: Allergy & Precautions, H&P , NPO status , Patient's Chart, lab work & pertinent test results  History of Anesthesia Complications Negative for: history of anesthetic complications  Airway Mallampati: II  TM Distance: >3 FB Neck ROM: full    Dental  (+) Dental Advidsory Given, Partial Upper, Partial Lower, Poor Dentition, Missing   Pulmonary neg shortness of breath, neg COPD, neg recent URI, former smoker,           Cardiovascular (-) angina(-) Past MI and (-) Cardiac Stents negative cardio ROS  (-) dysrhythmias      Neuro/Psych PSYCHIATRIC DISORDERS Depression negative neurological ROS     GI/Hepatic negative GI ROS, Neg liver ROS,   Endo/Other  negative endocrine ROS  Renal/GU      Musculoskeletal   Abdominal   Peds  Hematology negative hematology ROS (+)   Anesthesia Other Findings Obese  Past Medical History: No date: Arthritis No date: Depression  Past Surgical History: No date: NO PAST SURGERIES     Reproductive/Obstetrics negative OB ROS                             Anesthesia Physical  Anesthesia Plan  ASA: II  Anesthesia Plan: Spinal   Post-op Pain Management:    Induction:   PONV Risk Score and Plan: Propofol infusion  Airway Management Planned: Simple Face Mask and Natural Airway  Additional Equipment:   Intra-op Plan:   Post-operative Plan:   Informed Consent: I have reviewed the patients History and Physical, chart, labs and discussed the procedure including the risks, benefits and alternatives for the proposed anesthesia with the patient or authorized representative who has indicated his/her understanding and acceptance.     Dental Advisory Given  Plan Discussed with: Anesthesiologist and CRNA  Anesthesia Plan Comments:         Anesthesia  Quick Evaluation

## 2018-10-27 NOTE — TOC Progression Note (Signed)
Transition of Care [facility]) - Progression Note    Patient Details  Name: Hayden Rush MRN: [id number] Date of Birth: 05-Dec-1950  Transition of [facility]) CM/SW Contact  Analycia Khokhar, Lenice Llamas Phone Number: [phone]  10/27/2018, 5:22 PM  Clinical Narrative: Lovenox price requested.          Expected Discharge Plan and Services           Expected Discharge Date: 10/29/18                                     Social Determinants of Health (SDOH) Interventions    Readmission Risk Interventions No flowsheet data found.

## 2018-10-28 LAB — CBC
HCT: 43.6 % (ref 39.0–52.0)
Hemoglobin: 14.5 g/dL (ref 13.0–17.0)
MCH: 31.3 pg (ref 26.0–34.0)
MCHC: 33.3 g/dL (ref 30.0–36.0)
MCV: 94.2 fL (ref 80.0–100.0)
Platelets: 186 10*3/uL (ref 150–400)
RBC: 4.63 MIL/uL (ref 4.22–5.81)
RDW: 12 % (ref 11.5–15.5)
WBC: 8 10*3/uL (ref 4.0–10.5)
nRBC: 0 % (ref 0.0–0.2)

## 2018-10-28 LAB — BASIC METABOLIC PANEL
Anion gap: 8 (ref 5–15)
BUN: 9 mg/dL (ref 8–23)
CO2: 24 mmol/L (ref 22–32)
Calcium: 8.8 mg/dL — ABNORMAL LOW (ref 8.9–10.3)
Chloride: 106 mmol/L (ref 98–111)
Creatinine, Ser: 0.82 mg/dL (ref 0.61–1.24)
GFR calc Af Amer: >60 mL/min (ref >60)
GFR calc non Af Amer: >60 mL/min (ref >60)
Glucose, Bld: 144 mg/dL — ABNORMAL HIGH (ref 70–99)
Potassium: 4 mmol/L (ref 3.5–5.1)
Sodium: 138 mmol/L (ref 135–145)

## 2018-10-28 NOTE — TOC Benefit Eligibility Note (Signed)
Transition of Care [facility]) Benefit Eligibility Note    Patient Details  Name: Hayden Rush MRN: [id number] Date of Birth: 1950-07-01   Medication/Dose: Lovenox '40mg'$  once daily for 14 days  Covered?: No  Tier: (If approved, considered at Tier 4 level.)  Prescription Coverage Preferred Pharmacy: [place] [place] with Person/Company/Phone Number:: Helyn App with [employer] at [phone]  Co-Pay: If approved, subject to deductible.  Once deductible met, patient responsible for 45% co-insurance.  Prior Approval: Yes(PA required for name brand: [phone] or through [email or web address].)  Deductible: Unmet($195 deductible applies for Tier 3, 4, and 5 medications.  $15.85 met as of time of call.)  Additional Notes: Generic Enoxaparin covered with no PA required.  Considered Tier 4. Once deductible satisfied, patient responsible for 45% co-insurance cost.    Dannette Barbara Phone Number: [phone] or [phone] 10/28/2018, 1:02 PM

## 2018-10-28 NOTE — Progress Notes (Signed)
Physical Therapy Treatment Patient Details Name: Hayden RifeRandy W Rush MRN: [id number] DOB: 09/17/1950 Today's Date: 10/28/2018    History of Present Illness Pt is a 68 year old male admitted s/p L anterior THA.  PMH includes depression and arthritis.    PT Comments    Pt reporting 1-2/10 L hip pain at rest; 5/10 with activity; and 3/10 end of session resting in chair.  Able to progress to ambulating 130 feet CGA with RW.  Mild impulsiveness noted at times but pt responded well to cueing.  Will continue to focus on strengthening and progressive functional mobility per pt tolerance.    Follow Up Recommendations  Home health PT;Supervision - Intermittent     Equipment Recommendations  Rolling walker with 5" wheels    Recommendations for Other Services       Precautions / Restrictions Precautions Precautions: Fall;Anterior Hip Precaution Booklet Issued: No Precaution Comments: Wound vac Restrictions Weight Bearing Restrictions: Yes LLE Weight Bearing: Weight bearing as tolerated    Mobility  Bed Mobility         General bed mobility comments: Deferred (pt up in chair at beginning/end of session)  Transfers Overall transfer level: Needs assistance Equipment used: Rolling walker (2 wheeled) Transfers: Sit to/from Stand Sit to Stand: Supervision         General transfer comment: fairly strong stand noted from recliner up to RW; minimal vc's for positioning when sitting  Ambulation/Gait Ambulation/Gait assistance: Min guard Gait Distance (Feet): 130 Feet Assistive device: Rolling walker (2 wheeled) Gait Pattern/deviations: Antalgic;Decreased stance time - left Gait velocity: very mildly decreased   General Gait Details: partial step through gait pattern; steady with RW   Stairs             Wheelchair Mobility    Modified Rankin (Stroke Patients Only)       Balance Overall balance assessment: Needs assistance Sitting-balance support: No upper extremity  supported;Feet supported Sitting balance-Leahy Scale: Good Sitting balance - Comments: steady sitting reaching within BOS   Standing balance support: No upper extremity supported Standing balance-Leahy Scale: Good Standing balance comment: steady standing reaching within BOS                            Cognition Arousal/Alertness: Awake/alert Behavior During Therapy: Impulsive Overall Cognitive Status: Within Functional Limits for tasks assessed                                      Exercises Total Joint Exercises Long Arc Quad: AROM;Strengthening;Both;10 reps;Seated   General Comments General comments (skin integrity, edema, etc.): wound vac intact beginning/end of session      Pertinent Vitals/Pain Pain Assessment: 0-10 Pain Score: 3  Pain Location: L hip Pain Descriptors / Indicators: Sore;Guarding;Grimacing Pain Intervention(s): Limited activity within patient's tolerance;Monitored during session;Premedicated before session;Repositioned;Other (comment)(NT notified of need for ice for ice pack)  Vitals (HR and O2 on room air) stable and WFL throughout treatment session.    Home Living   Prior Function    PT Goals (current goals can now be found in the care plan section) Acute Rehab PT Goals Patient Stated Goal: To return to playing golf. PT Goal Formulation: With patient Time For Goal Achievement: 11/10/18 Potential to Achieve Goals: Good Progress towards PT goals: Progressing toward goals    Frequency    BID      PT  Plan Current plan remains appropriate    Co-evaluation              AM-PAC PT "6 Clicks" Mobility   Outcome Measure  Help needed turning from your back to your side while in a flat bed without using bedrails?: None Help needed moving from lying on your back to sitting on the side of a flat bed without using bedrails?: A Little Help needed moving to and from a bed to a chair (including a wheelchair)?: A  Little Help needed standing up from a chair using your arms (e.g., wheelchair or bedside chair)?: A Little Help needed to walk in hospital room?: A Little Help needed climbing 3-5 steps with a railing? : A Little 6 Click Score: 19    End of Session Equipment Utilized During Treatment: Gait belt Activity Tolerance: Patient tolerated treatment well Patient left: in chair;with chair alarm set;with call bell/phone within reach;with SCD's reapplied Nurse Communication: Mobility status;Precautions;Weight bearing status(via white board) PT Visit Diagnosis: Unsteadiness on feet (R26.81);Muscle weakness (generalized) (M62.81);Difficulty in walking, not elsewhere classified (R26.2);Pain Pain - Right/Left: Left Pain - part of body: Hip     Time: 9201-0071 PT Time Calculation (min) (ACUTE ONLY): 24 min  Charges:  $Gait Training: 8-22 mins $Therapeutic Exercise: 8-22 mins                    Leitha Bleak, PT 10/28/18, 1:52 PM [phone]

## 2018-10-28 NOTE — Evaluation (Signed)
Occupational Therapy Evaluation Patient Details Name: Hayden RifeRandy W Rush MRN: [id number] DOB: 01/19/1951 Today's Date: 10/28/2018    History of Present Illness Pt is a 68 year old male admitted s/p L anterior THA.  PMH includes depression and arthritis.   Clinical Impression   Pt seen for OT evaluation this date, POD#1 from above surgery. Pt was independent in all ADLs prior to surgery, enjoys golfing and works part time. However, he had begun to feel limited in his ability to work and move about in the community due to increased pain in his L hip. Pt is eager to return to PLOF with less pain and improved safety and independence. Pt currently requires minimal to moderate assist for LB dressing and bathing while in seated position due to pain and limited AROM of L hip. Pt instructed in self care skills, falls prevention strategies, home/routines modifications, and compression stocking mgt strategies. Pt would benefit from additional instruction in self care skills and techniques to help maintain precautions with or without assistive devices to support recall and carryover prior to discharge. Recommend HHOT upon discharge.      Follow Up Recommendations  Home health OT    Equipment Recommendations  3 in 1 bedside commode    Recommendations for Other Services       Precautions / Restrictions Precautions Precautions: Fall Restrictions Weight Bearing Restrictions: Yes LLE Weight Bearing: Weight bearing as tolerated      Mobility Bed Mobility Overal bed mobility: Needs Assistance Bed Mobility: Supine to Sit     Supine to sit: Min assist     General bed mobility comments: Increased time + asssit for mgt of LLE over EOB.  Transfers Overall transfer level: Needs assistance Equipment used: Rolling walker (2 wheeled) Transfers: Sit to/from Stand Sit to Stand: Min guard         General transfer comment: Pt able to rise from bedside without physical assistance; required VC's for use of  RW and sequencing. Was impulsive at times with movements and required regular prompting in order to slow movements and attend to safety instructions.    Balance Overall balance assessment: Needs assistance Sitting-balance support: Feet supported Sitting balance-Leahy Scale: Good     Standing balance support: Bilateral upper extremity supported Standing balance-Leahy Scale: Fair Standing balance comment: Requires use of RW at this time                           ADL either performed or assessed with clinical judgement   ADL Overall ADL's : Needs assistance/impaired                                       General ADL Comments: Pt at baseline for UB ADLs, grooming, self-feeding, etc. Requires min-mod assist for mgt of LB dressing and bathing tasks. Can complete toilet transfer/functional mobility using RW with supervision and VCs for sequencing. Requires assist for mgt of wound vac and IV pole at time of eval. Impulsive with actions and limited safety judgement. Endorses getting up to go to the bathroom without assist, educated on falls prevention strategies for home and hospital including strong encouragement to call for assistance before getting OOB/chair.     Vision Baseline Vision/History: Wears glasses Wears Glasses: At all times Patient Visual Report: No change from baseline       Perception     Praxis  Pertinent Vitals/Pain Pain Score: 3  Pain Location: L hip Pain Descriptors / Indicators: Sore;Guarding;Grimacing Pain Intervention(s): Limited activity within patient's tolerance;Monitored during session;Repositioned;Ice applied     Hand Dominance     Extremity/Trunk Assessment Upper Extremity Assessment Upper Extremity Assessment: Overall WFL for tasks assessed   Lower Extremity Assessment Lower Extremity Assessment: Defer to PT evaluation;LLE deficits/detail LLE Deficits / Details: s/p L THA decreased AROM and pain limited on this  date. LLE: Unable to fully assess due to pain LLE Coordination: decreased gross motor   Cervical / Trunk Assessment Cervical / Trunk Assessment: Normal   Communication Communication Communication: No difficulties   Cognition Arousal/Alertness: Awake/alert Behavior During Therapy: WFL for tasks assessed/performed;Impulsive Overall Cognitive Status: Within Functional Limits for tasks assessed                                 General Comments: Pt at times impulsive, and lacking safety awareness. Required prompting to slow movements and take his time.   General Comments  wound vac in place start/end of session. RN in room to DC IV during session. Pt call bell going off multiple times without initiation from pt or this therapist. Appears to be malfuctioning RN/unit secretary aware.    Exercises Other Exercises Other Exercises: Pt educated in falls prevention strategies, safe use of AE for ADL, routines modifications, compression stocking mgt, and safe strategies for functional mobility for improved safety functional independence upon hospital DC. Other Exercises: Pt assisted to bathroom on this date. Was able to complete toileting and peri-care independently. Required assist for STS from room commode.   Shoulder Instructions      Home Living Family/patient expects to be discharged to:: Private residence Living Arrangements: Other relatives Available Help at Discharge: Family;Available PRN/intermittently Type of Home: House Home Access: Stairs to enter Entergy CorporationEntrance Stairs-Number of Steps: 4 Entrance Stairs-Rails: Can reach both Home Layout: One level     Bathroom Shower/Tub: Tub/shower unit;Walk-in shower   Bathroom Toilet: Standard Bathroom Accessibility: Yes   Home Equipment: Toilet riser   Additional Comments: Pt unsure about all equipment available to him. States his brother is going to be setting some items up (toilet riser at least) but isn't sure what all he  has.      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        OT Problem List: Pain;Decreased strength;Decreased coordination;Decreased range of motion;Decreased activity tolerance;Decreased safety awareness;Impaired balance (sitting and/or standing);Decreased knowledge of use of DME or AE;Decreased knowledge of precautions      OT Treatment/Interventions: Self-care/ADL training;Balance training;Therapeutic exercise;Therapeutic activities;DME and/or AE instruction;Patient/family education    OT Goals(Current goals can be found in the care plan section) Acute Rehab OT Goals Patient Stated Goal: To return to playing golf. OT Goal Formulation: With patient Time For Goal Achievement: 11/11/18 Potential to Achieve Goals: Good ADL Goals Pt Will Perform Lower Body Bathing: with supervision;with adaptive equipment;sit to/from stand(With LRAD PRN for improved safety and functional independence.) Pt Will Perform Lower Body Dressing: with supervision;with set-up;sit to/from stand;with adaptive equipment(With LRAD PRN for improved safety and functional independence.) Pt Will Transfer to Toilet: with modified independence;ambulating;regular height toilet(With LRAD PRN for improved safety and functional independence.)  OT Frequency: Min 1X/week   Barriers to D/C: Inaccessible home environment  Co-evaluation              AM-PAC OT "6 Clicks" Daily Activity     Outcome Measure Help from another person eating meals?: None Help from another person taking care of personal grooming?: None Help from another person toileting, which includes using toliet, bedpan, or urinal?: A Little Help from another person bathing (including washing, rinsing, drying)?: A Little Help from another person to put on and taking off regular  upper body clothing?: A Little Help from another person to put on and taking off regular lower body clothing?: A Little 6 Click Score: 20   End of Session Equipment Utilized During Treatment: Gait belt;Rolling walker Nurse Communication: Other (comment)(Call bell malfunction/IV alarm during session.)  Activity Tolerance: Patient tolerated treatment well Patient left: in chair;with chair alarm set;with call bell/phone within reach;Other (comment);with SCD's reapplied(With wound vac in place.)  OT Visit Diagnosis: Other abnormalities of gait and mobility (R26.89);Pain Pain - Right/Left: Left Pain - part of body: Hip                Time: 1962-2297 OT Time Calculation (min): 38 min Charges:  OT General Charges $OT Visit: 1 Visit OT Evaluation $OT Eval Low Complexity: 1 Low OT Treatments $Self Care/Home Management : 23-37 mins  Shara Blazing, M.S., OTR/L Ascom: [phone] 10/28/18, 11:19 AM

## 2018-10-28 NOTE — Progress Notes (Signed)
Physical Therapy Treatment Patient Details Name: Hayden Rush MRN: [id number] DOB: Apr 01, 1951 Today's Date: 10/28/2018    History of Present Illness 68 year old male admitted s/p L anterior THA.  PMH includes depression and arthritis.    PT Comments    Pt did well with PT session this afternoon.  Able to circumambulate the nurses station with initially slow and guarded but ultimately consistent and relatively confidence cadence and minimal reliance on the walker.  Pt showed good effort with mobility and seated exercises and though he initially needed some assist with L hip flexion reps he was able to flex hip against gravity actively once he was warmed up.    Follow Up Recommendations  Home health PT;Supervision - Intermittent     Equipment Recommendations  Rolling walker with 5" wheels    Recommendations for Other Services       Precautions / Restrictions Precautions Precautions: Fall;Anterior Hip Restrictions Weight Bearing Restrictions: Yes LLE Weight Bearing: Weight bearing as tolerated    Mobility  Bed Mobility               General bed mobility comments: in recliner on arrival, mobility deferred  Transfers Overall transfer level: Needs assistance Equipment used: Rolling walker (2 wheeled) Transfers: Sit to/from Stand Sit to Stand: Supervision Stand pivot transfers: Supervision       General transfer comment: Pt able to rise with confidence and w/o assist, cues to insure safe use of walker  Ambulation/Gait Ambulation/Gait assistance: Min guard Gait Distance (Feet): 200 Feet Assistive device: Rolling walker (2 wheeled)       General Gait Details: Pt initially with stiff, arresting gait, however was able to quickly improve cadence and decrease walker reliance (set walker for home to appropriate height) and was able to maintain consistent and confidence cadence t/o majority of ambulation.  Regular cuing for posture, L hip mechanics; O2 stayed in mid/low 90s  t/o most of ambulation   Stairs             Wheelchair Mobility    Modified Rankin (Stroke Patients Only)       Balance Overall balance assessment: Modified Independent                                          Cognition Arousal/Alertness: Awake/alert Behavior During Therapy: WFL for tasks assessed/performed;Impulsive Overall Cognitive Status: Within Functional Limits for tasks assessed                                        Exercises Total Joint Exercises Ankle Circles/Pumps: AROM;10 reps Hip ABduction/ADduction: Strengthening;10 reps Long Arc Quad: Strengthening;10 reps Knee Flexion: Strengthening;10 reps Marching in Standing: AAROM;AROM;10 reps;Seated(AAROM X first 5 reps on L)    General Comments        Pertinent Vitals/Pain Pain Assessment: 0-10 Pain Score: 3  Pain Location: L hip, minimal increase with ambulation    Home Living                      Prior Function            PT Goals (current goals can now be found in the care plan section) Progress towards PT goals: Progressing toward goals    Frequency    BID  PT Plan Current plan remains appropriate    Co-evaluation              AM-PAC PT "6 Clicks" Mobility   Outcome Measure  Help needed turning from your back to your side while in a flat bed without using bedrails?: None Help needed moving from lying on your back to sitting on the side of a flat bed without using bedrails?: A Little Help needed moving to and from a bed to a chair (including a wheelchair)?: None Help needed standing up from a chair using your arms (e.g., wheelchair or bedside chair)?: None Help needed to walk in hospital room?: None Help needed climbing 3-5 steps with a railing? : A Little 6 Click Score: 22    End of Session Equipment Utilized During Treatment: Gait belt Activity Tolerance: Patient tolerated treatment well Patient left: in chair;with chair  alarm set;with call bell/phone within reach;with SCD's reapplied Nurse Communication: Mobility status PT Visit Diagnosis: Unsteadiness on feet (R26.81);Muscle weakness (generalized) (M62.81);Difficulty in walking, not elsewhere classified (R26.2);Pain Pain - Right/Left: Left Pain - part of body: Hip     Time: [id number] PT Time Calculation (min) (ACUTE ONLY): 25 min  Charges:  $Gait Training: 8-22 mins $Therapeutic Exercise: 8-22 mins                     Malachi ProGalen R Smita Lesh, DPT 10/28/2018, 6:09 PM

## 2018-10-28 NOTE — Anesthesia Postprocedure Evaluation (Signed)
Anesthesia Post Note  Patient: Hayden Rush  Procedure(s) Performed: TOTAL HIP ARTHROPLASTY ANTERIOR APPROACH (Left )  Patient location during evaluation: Nursing Unit Anesthesia Type: Spinal Level of consciousness: awake, oriented and awake and alert Pain management: pain level controlled Vital Signs Assessment: post-procedure vital signs reviewed and stable Respiratory status: spontaneous breathing and respiratory function stable Cardiovascular status: blood pressure returned to baseline Postop Assessment: no headache Anesthetic complications: no Comments: Using ice on hip     Last Vitals:  Vitals:   10/28/18 0347 10/28/18 0752  BP: 111/82 130/83  Pulse: (!) 108 94  Resp: 19 18  Temp: 37.8 C 36.9 C  SpO2: 95% 95%    Last Pain:  Vitals:   10/28/18 0752  TempSrc: Oral  PainSc:                  [name]

## 2018-10-28 NOTE — Progress Notes (Signed)
   Subjective: 1 Day Post-Op Procedure(s) (LRB): TOTAL HIP ARTHROPLASTY ANTERIOR APPROACH (Left) Patient reports pain as mild.   Patient is well, and has had no acute complaints or problems Denies any CP, SOB, ABD pain. We will continue therapy today.  Plan is to go Home after hospital stay.  Objective: Vital signs in last 24 hours: Temp:  [96.8 F (36 C)-100.1 F (37.8 C)] 100.1 F (37.8 C) (06/19 0347) Pulse Rate:  [62-108] 108 (06/19 0347) Resp:  [10-21] 19 (06/19 0347) BP: (90-161)/(63-100) 111/82 (06/19 0347) SpO2:  [92 %-100 %] 95 % (06/19 0347)  Intake/Output from previous day: 06/18 0701 - 06/19 0700 In: 2041.9 [P.O.:30; I.V.:1911.8; IV Piggyback:100.1] Out: 1450 [Urine:1400; Blood:50] Intake/Output this shift: No intake/output data recorded.  Recent Labs    10/27/18 1217 10/28/18 0648  HGB 14.2 14.5   Recent Labs    10/27/18 1217 10/28/18 0648  WBC 8.1 8.0  RBC 4.52 4.63  HCT 43.1 43.6  PLT 183 186   Recent Labs    10/27/18 1217 10/28/18 0648  NA  --  138  K  --  4.0  CL  --  106  CO2  --  24  BUN  --  9  CREATININE 0.80 0.82  GLUCOSE  --  144*  CALCIUM  --  8.8*   No results for input(s): LABPT, INR in the last 72 hours.  EXAM General - Patient is Alert, Appropriate and Oriented Extremity - Neurovascular intact Sensation intact distally Intact pulses distally Dorsiflexion/Plantar flexion intact No cellulitis present Compartment soft Dressing - dressing C/D/I and no drainage, 100 cc drainage provena Motor Function - intact, moving foot and toes well on exam.   Past Medical History:  Diagnosis Date  . Arthritis   . Depression     Assessment/Plan:   1 Day Post-Op Procedure(s) (LRB): TOTAL HIP ARTHROPLASTY ANTERIOR APPROACH (Left) Active Problems:   Status post total hip replacement, left  Estimated body mass index is 35.04 kg/m as calculated from the following:   Height as of 10/17/18: 5\' 5"  (1.651 m).   Weight as of 10/17/18:  95.5 kg. Advance diet Up with therapy  Labs stable VSS -tachycardic, continue to monitor.  No chest pain, shortness of breath, cough. Low-grade temp 100.1 -continue to monitor, encourage incentive spirometer Care management to assist with discharge to home with home health PT  DVT Prophylaxis - Lovenox, TED hose and SCDs Weight-Bearing as tolerated to left leg   T. Rachelle Hora, PA-C [place] 10/28/2018, 7:52 AM

## 2018-10-28 NOTE — TOC Initial Note (Signed)
Transition of Care [facility]) - Initial/Assessment Note    Patient Details  Name: Hayden Rush MRN: [id number] Date of Birth: 12/01/1950  Transition of [facility] CM/SW Contact:    Tomika Eckles, Lenice Llamas Phone Number: [phone]  10/28/2018, 4:40 PM  Clinical Narrative: PT is recommending home health. Clinical Social Worker (CSW) met with patient to discuss D/C plan. Patient is alert and oriented X4 and was sitting in the chair at bedside. CSW introduced self and explained role of CSW department. Per patient he is going to stay with his brother Hayden Rush at [place]. [place], [place] after he leaves [facility]. Patient reported that he needs a rolling walker and does not need a bedside commode. [employer] agency representative is aware of above. CSW made patient aware that his surgeon prefers [facility]. Patient is agreeable to [facility]. Per Helene Kelp they can accept patient. CSW made patient aware of his Lovenox price $195. CSW also gave patient a good Rx card to use at [facility] so he can get Lovenox for $90.95. CSW made patient aware that if he uses the good Rx card the medication will not go towards his deductible. Patient verbalized his understanding. CSW will continue to follow and assist as needed.                  Expected Discharge Plan: [place] Barriers to Discharge: Continued Medical Work up   Patient Goals and CMS Choice Patient states their goals for this hospitalization and ongoing recovery are:: To improve mobility. CMS Medicare.gov Compare Post Acute Care list provided to:: (Patient's surgeon's office arranged home health with [facility] prior to hospital stay.) Choice offered to / list presented to : Patient(Patient is agreeable to [facility]  Expected Discharge Plan and Services Expected Discharge Plan: [place] In-house Referral: Clinical Social Work Discharge Planning Services: CM Consult Post Acute Care Choice: Durable Medical  Equipment, Home Health Living arrangements for the past 2 months: Single Family Home Expected Discharge Date: 10/29/18               DME Arranged: [place]ford Rile rolling DME Agency: [employer] Date DME Agency Contacted: 10/28/18 Time DME Agency Contacted: 24 Representative spoke with at DME Agency: [place]: PT [place]: [facility] [place] (formerly [employer]) Date [place]: 10/27/18 Time [place]: 1700 Representative spoke with at [place]: [place] Arrangements/Services Living arrangements for the past 2 months: [place] with:: Self Patient language and need for interpreter reviewed:: No Do you feel safe going back to the place where you live?: Yes      Need for Family Participation in Patient Care: No (Comment) Care giver support system in place?: Yes (comment)   Criminal Activity/Legal Involvement Pertinent to Current Situation/Hospitalization: No - Comment as needed  Activities of Daily Living Home Assistive Devices/Equipment: Eyeglasses, Dentures (specify type) ADL Screening (condition at time of admission) Patient's cognitive ability adequate to safely complete daily activities?: Yes Is the patient deaf or have difficulty hearing?: No Does the patient have difficulty seeing, even when wearing glasses/contacts?: No Does the patient have difficulty concentrating, remembering, or making decisions?: No Patient able to express need for assistance with ADLs?: Yes Does the patient have difficulty dressing or bathing?: No Independently performs ADLs?: Yes (appropriate for developmental age) Does the patient have difficulty walking or climbing stairs?: Yes Weakness of Legs: Left Weakness of Arms/Hands: None  Permission Sought/Granted Permission sought to share information with : Other (comment)(Home Health [facility].) Permission granted to share information with : Yes, Verbal Permission Granted               Emotional Assessment Appearance:: Appears stated age   Affect (typically observed): Pleasant, Calm Orientation: : Oriented to Self, Oriented to  Time, Oriented to Situation, Oriented to Place Alcohol / Substance Use: Not Applicable Psych Involvement: No (comment)  Admission diagnosis:  PRIMARY LOCALIZED OSTEOARTHRITIS OF LEFT HIP Patient Active Problem List   Diagnosis Date Noted  . Status post total hip replacement, left 10/27/2018   PCP:  Laneta Simmers, NP Pharmacy:   [facility] [address] [place] [address] Phone: [phone] Fax: [phone]     Social Determinants of Health (SDOH) Interventions    Readmission Risk Interventions No flowsheet data found.

## 2018-10-29 MED ORDER — APIXABAN 5 MG PO TABS: 5.0000 mg | ORAL_TABLET | Freq: Every day | ORAL | 0 refills | Status: DC

## 2018-10-29 MED ORDER — TRAMADOL HCL 50 MG PO TABS: 50.0000 mg | ORAL_TABLET | Freq: Four times a day (QID) | ORAL | 1 refills | Status: DC

## 2018-10-29 MED ORDER — HYDROCODONE-ACETAMINOPHEN 5-325 MG PO TABS: 1.0000 | ORAL_TABLET | ORAL | 0 refills | Status: DC | PRN

## 2018-10-29 MED ORDER — ENOXAPARIN SODIUM 40 MG/0.4ML ~~LOC~~ SOLN: 40.0000 mg | SUBCUTANEOUS | 0 refills | Status: DC

## 2018-10-29 NOTE — Progress Notes (Signed)
   Subjective: 2 Days Post-Op Procedure(s) (LRB): TOTAL HIP ARTHROPLASTY ANTERIOR APPROACH (Left) Patient reports pain as mild.   Patient is well, and has had no acute complaints or problems Denies any CP, SOB, ABD pain. We will continue therapy today.  Plan is to go Home after hospital stay.  Objective: Vital signs in last 24 hours: Temp:  [98.1 F (36.7 C)-98.9 F (37.2 C)] 98.1 F (36.7 C) (06/20 0731) Pulse Rate:  [87-97] 92 (06/20 0731) Resp:  [16-18] 16 (06/20 0731) BP: (121-154)/(82-94) 133/82 (06/20 0731) SpO2:  [93 %-97 %] 97 % (06/20 0731)  Intake/Output from previous day: 06/19 0701 - 06/20 0700 In: 1199.6 [P.O.:1080; I.V.:119.6] Out: 1825 [Urine:1775; Drains:50] Intake/Output this shift: No intake/output data recorded.  Recent Labs    10/27/18 1217 10/28/18 0648  HGB 14.2 14.5   Recent Labs    10/27/18 1217 10/28/18 0648  WBC 8.1 8.0  RBC 4.52 4.63  HCT 43.1 43.6  PLT 183 186   Recent Labs    10/27/18 1217 10/28/18 0648  NA  --  138  K  --  4.0  CL  --  106  CO2  --  24  BUN  --  9  CREATININE 0.80 0.82  GLUCOSE  --  144*  CALCIUM  --  8.8*   No results for input(s): LABPT, INR in the last 72 hours.  EXAM General - Patient is Alert, Appropriate and Oriented Extremity - Neurovascular intact Sensation intact distally Intact pulses distally Dorsiflexion/Plantar flexion intact No cellulitis present Compartment soft Dressing - dressing C/D/I and no drainage, 100 cc drainage provena Motor Function - intact, moving foot and toes well on exam.  Ambulated 200 feet with physical therapy  Past Medical History:  Diagnosis Date  . Arthritis   . Depression     Assessment/Plan:   2 Days Post-Op Procedure(s) (LRB): TOTAL HIP ARTHROPLASTY ANTERIOR APPROACH (Left) Active Problems:   Status post total hip replacement, left  Estimated body mass index is 35.04 kg/m as calculated from the following:   Height as of 10/17/18: 5\' 5"  (1.651 m).  Weight as of 10/17/18: 95.5 kg. Advance diet Up with therapy  Labs stable VSS -tachycardic, continue to monitor.  No chest pain, shortness of breath, cough.  No fever. Care management to assist with discharge to home with home health PT Ready to go home today, if cleared by medicine  DVT Prophylaxis - Lovenox, TED hose and SCDs Weight-Bearing as tolerated to left leg   Hayden [place], PA-C [place] 10/29/2018, 7:46 AM

## 2018-10-29 NOTE — Discharge Instructions (Signed)
ANTERIOR APPROACH TOTAL HIP REPLACEMENT POSTOPERATIVE DIRECTIONS   Hip Rehabilitation, Guidelines Following Surgery  The results of a hip operation are greatly improved after range of motion and muscle strengthening exercises. Follow all safety measures which are given to protect your hip. If any of these exercises cause increased pain or swelling in your joint, decrease the amount until you are comfortable again. Then slowly increase the exercises. Call your caregiver if you have problems or questions.   HOME CARE INSTRUCTIONS  Remove items at home which could result in a fall. This includes throw rugs or furniture in walking pathways.   ICE to the affected hip every three hours for 30 minutes at a time and then as needed for pain and swelling.  Continue to use ice on the hip for pain and swelling from surgery. You may notice swelling that will progress down to the foot and ankle.  This is normal after surgery.  Elevate the leg when you are not up walking on it.    Continue to use the breathing machine which will help keep your temperature down.  It is common for your temperature to cycle up and down following surgery, especially at night when you are not up moving around and exerting yourself.  The breathing machine keeps your lungs expanded and your temperature down.  Do not place pillow under knee, focus on keeping the knee straight while resting  DIET You may resume your previous home diet once your are discharged from the hospital.  DRESSING / WOUND CARE / SHOWERING The wound VAC can remain in place for 7 days.  If it begins to be consistently, it can be removed at 7 days.  Physical therapy can apply a new dressing. Keep the surgical dressing until follow up.  The dressing is water proof, so you can shower without any extra covering.  IF THE DRESSING FALLS OFF or the wound gets wet inside, change the dressing with sterile gauze.  Please use good hand washing techniques before changing the  dressing.  Do not use any lotions or creams on the incision until instructed by your surgeon.   Keep your dressing dry with showering.  You can keep it covered and pat dry. Change the surgical dressing daily and reapply a dry dressing each time.  ACTIVITY Walk with your walker as instructed. Use walker as long as suggested by your caregivers. Avoid periods of inactivity such as sitting longer than an hour when not asleep. This helps prevent blood clots.  You may resume a sexual relationship in one month or when given the OK by your doctor.  You may return to work once you are cleared by your doctor.  Do not drive a car for 6 weeks or until released by you surgeon.  Do not drive while taking narcotics.  WEIGHT BEARING Weight bearing as tolerated with assist device (walker, cane, etc) as directed, use it as long as suggested by your surgeon or therapist, typically at least 4-6 weeks.  POSTOPERATIVE CONSTIPATION PROTOCOL Constipation - defined medically as fewer than three stools per week and severe constipation as less than one stool per week.  One of the most common issues patients have following surgery is constipation.  Even if you have a regular bowel pattern at home, your normal regimen is likely to be disrupted due to multiple reasons following surgery.  Combination of anesthesia, postoperative narcotics, change in appetite and fluid intake all can affect your bowels.  In order to avoid complications following  surgery, here are some recommendations in order to help you during your recovery period.  Colace (docusate) - Pick up an over-the-counter form of Colace or another stool softener and take twice a day as long as you are requiring postoperative pain medications.  Take with a full glass of water daily.  If you experience loose stools or diarrhea, hold the colace until you stool forms back up.  If your symptoms do not get better within 1 week or if they get worse, check with your  doctor.  Dulcolax (bisacodyl) - Pick up over-the-counter and take as directed by the product packaging as needed to assist with the movement of your bowels.  Take with a full glass of water.  Use this product as needed if not relieved by Colace only.   MiraLax (polyethylene glycol) - Pick up over-the-counter to have on hand.  MiraLax is a solution that will increase the amount of water in your bowels to assist with bowel movements.  Take as directed and can mix with a glass of water, juice, soda, coffee, or tea.  Take if you go more than two days without a movement. Do not use MiraLax more than once per day. Call your doctor if you are still constipated or irregular after using this medication for 7 days in a row.  If you continue to have problems with postoperative constipation, please contact the office for further assistance and recommendations.  If you experience "the worst abdominal pain ever" or develop nausea or vomiting, please contact the office immediatly for further recommendations for treatment.  ITCHING  If you experience itching with your medications, try taking only a single pain pill, or even half a pain pill at a time.  You can also use Benadryl over the counter for itching or also to help with sleep.   TED HOSE STOCKINGS Wear the elastic stockings on both legs for three weeks following surgery during the day but you may remove then at night for sleeping.  MEDICATIONS See your medication summary on the After Visit Summary that the nursing staff will review with you prior to discharge.  You may have some home medications which will be placed on hold until you complete the course of blood thinner medication.  It is important for you to complete the blood thinner medication as prescribed by your surgeon.  Continue your approved medications as instructed at time of discharge.  PRECAUTIONS If you experience chest pain or shortness of breath - call 911 immediately for transfer to the  hospital emergency department.  If you develop a fever greater that 101 F, purulent drainage from wound, increased redness or drainage from wound, foul odor from the wound/dressing, or calf pain - CONTACT YOUR SURGEON.                                                   FOLLOW-UP APPOINTMENTS Make sure you keep all of your appointments after your operation with your surgeon and caregivers. You should call the office at the above phone number and make an appointment for approximately two weeks after the date of your surgery or on the date instructed by your surgeon outlined in the "After Visit Summary".  RANGE OF MOTION AND STRENGTHENING EXERCISES  These exercises are designed to help you keep full movement of your hip joint. Follow your caregiver's or  physical therapist's instructions. Perform all exercises about fifteen times, three times per day or as directed. Exercise both hips, even if you have had only one joint replacement. These exercises can be done on a training (exercise) mat, on the floor, on a table or on a bed. Use whatever works the best and is most comfortable for you. Use music or television while you are exercising so that the exercises are a pleasant break in your day. This will make your life better with the exercises acting as a break in routine you can look forward to.  Lying on your back, slowly slide your foot toward your buttocks, raising your knee up off the floor. Then slowly slide your foot back down until your leg is straight again.  Lying on your back spread your legs as far apart as you can without causing discomfort.  Lying on your side, raise your upper leg and foot straight up from the floor as far as is comfortable. Slowly lower the leg and repeat.  Lying on your back, tighten up the muscle in the front of your thigh (quadriceps muscles). You can do this by keeping your leg straight and trying to raise your heel off the floor. This helps strengthen the largest muscle  supporting your knee.  Lying on your back, tighten up the muscles of your buttocks both with the legs straight and with the knee bent at a comfortable angle while keeping your heel on the floor.   IF YOU ARE TRANSFERRED TO A SKILLED REHAB FACILITY If the patient is transferred to a skilled rehab facility following release from the hospital, a list of the current medications will be sent to the facility for the patient to continue.  When discharged from the skilled rehab facility, please have the facility set up the patient's Home Health Physical Therapy prior to being released. Also, the skilled facility will be responsible for providing the patient with their medications at time of release from the facility to include their pain medication, the muscle relaxants, and their blood thinner medication. If the patient is still at the rehab facility at time of the two week follow up appointment, the skilled rehab facility will also need to assist the patient in arranging follow up appointment in our office and any transportation needs.  MAKE SURE YOU:  Understand these instructions.  Get help right away if you are not doing well or get worse.    Pick up stool softner and laxative for home use following surgery while on pain medications. Do not submerge incision under water. Please use good hand washing techniques while changing dressing each day. May shower starting three days after surgery. Please use a clean towel to pat the incision dry following showers. Continue to use ice for pain and swelling after surgery. Do not use any lotions or creams on the incision until instructed by your surgeon.

## 2018-10-29 NOTE — Progress Notes (Signed)
Pt stating that Lovenox is expensive and wants to know if there is a different option that he can do. MD Poggi notified, and will take a look and order.

## 2018-10-29 NOTE — Progress Notes (Signed)
DISCHARGE NOTE:  Pt given discharge instructions and scripts. Pt verbalized understanding. Pt connected to [facility] wound vac, 2 honeycombs sent with pt. Pt sent home with TED hose, walker and personal belongings. Pt wheeled to car by staff, family providing transportation.

## 2018-10-29 NOTE — Progress Notes (Signed)
Physical Therapy Treatment Patient Details Name: Hayden Rush MRN: [id number] DOB: Jun 29, 1950 Today's Date: 10/29/2018    History of Present Illness 68 year old male admitted s/p L anterior THA.  PMH includes depression and arthritis.    PT Comments    Pt able to ambulate with RW around nursing loop with SBA and navigate 4 stairs with B railings safely with CGA.  Pt demonstrating appropriate understanding of LE HEP.  Pain 1-2/10 L hip beginning of session at rest and 3/10 L hip at rest end of session sitting in recliner.  Pt occasionally a little impulsive (requiring vc's to slow down) but overall appearing steady with functional mobility using RW.  Pt's delivered RW for home use checked for appropriate fit.  Plan to discharge home today.    Follow Up Recommendations  Home health PT;Supervision - Intermittent     Equipment Recommendations  Rolling walker with 5" wheels    Recommendations for Other Services       Precautions / Restrictions Precautions Precautions: Fall;Anterior Hip Precaution Booklet Issued: Yes (comment) Precaution Comments: Wound vac Restrictions Weight Bearing Restrictions: Yes LLE Weight Bearing: Weight bearing as tolerated    Mobility  Bed Mobility Overal bed mobility: Needs Assistance Bed Mobility: Supine to Sit;Sit to Supine     Supine to sit: Supervision(x2 trials) Sit to supine: Supervision(x1 trial)   General bed mobility comments: bed flat; initial vc's for technique; increased effort/time for pt to perform on own; pt using R LE to assist with L LE in/out of bed  Transfers Overall transfer level: Modified independent Equipment used: Rolling walker (2 wheeled) Transfers: Sit to/from [facility] Sit to Stand: Modified independent (Device/Increase time) Stand pivot transfers: Modified independent (Device/Increase time)       General transfer comment: steady safe transfers noted from bed and  toilet  Ambulation/Gait Ambulation/Gait assistance: Supervision Gait Distance (Feet): (20 feet (to bathroom); 100 feet (to stairs); 180 feet) Assistive device: Rolling walker (2 wheeled) Gait Pattern/deviations: Antalgic;Decreased stance time - left Gait velocity: very mildly decreased   General Gait Details: partial step through gait pattern; steady   Stairs Stairs: Yes Stairs assistance: Min guard Stair Management: Two rails;Step to pattern;Forwards Number of Stairs: 4 General stair comments: initial vc's for technique and pt able to then perform safely on own   Wheelchair Mobility    Modified Rankin (Stroke Patients Only)       Balance Overall balance assessment: Needs assistance Sitting-balance support: No upper extremity supported;Feet supported Sitting balance-Leahy Scale: Normal Sitting balance - Comments: steady sitting reaching outside BOS   Standing balance support: No upper extremity supported Standing balance-Leahy Scale: Good Standing balance comment: steady standing washing hands at sink                            Cognition Arousal/Alertness: Awake/alert Behavior During Therapy: WFL for tasks assessed/performed;Impulsive Overall Cognitive Status: Within Functional Limits for tasks assessed                                        Exercises Total Joint Exercises Ankle Circles/Pumps: AROM;Strengthening;Both;10 reps;Supine Quad Sets: AROM;Strengthening;Both;10 reps;Supine Gluteal Sets: AROM;Strengthening;Both;10 reps;Supine Towel Squeeze: AROM;Strengthening;Both;10 reps;Supine Short Arc Quad: AROM;Strengthening;Left;10 reps;Supine Heel Slides: AAROM;Strengthening;Left;10 reps;Supine Hip ABduction/ADduction: AAROM;Strengthening;Left;10 reps;Supine Straight Leg Raises: AAROM;Strengthening;Left;10 reps;Supine    General Comments General comments (skin integrity, edema, etc.): wound vac intact  beginning/end of session.  Pt  agreeable to PT session.      Pertinent Vitals/Pain Pain Assessment: 0-10 Pain Score: 3  Pain Location: L hip Pain Descriptors / Indicators: Sore Pain Intervention(s): Limited activity within patient's tolerance;Monitored during session;Premedicated before session;Repositioned;Ice applied  Vitals (O2 on room air; HR 92-111 bpm) stable and WFL throughout treatment session.    Home Living                      Prior Function            PT Goals (current goals can now be found in the care plan section) Acute Rehab PT Goals Patient Stated Goal: To return to playing golf. PT Goal Formulation: With patient Time For Goal Achievement: 11/10/18 Potential to Achieve Goals: Good Progress towards PT goals: Progressing toward goals    Frequency    BID      PT Plan Current plan remains appropriate    Co-evaluation              AM-PAC PT "6 Clicks" Mobility   Outcome Measure  Help needed turning from your back to your side while in a flat bed without using bedrails?: None Help needed moving from lying on your back to sitting on the side of a flat bed without using bedrails?: A Little Help needed moving to and from a bed to a chair (including a wheelchair)?: None Help needed standing up from a chair using your arms (e.g., wheelchair or bedside chair)?: None Help needed to walk in hospital room?: A Little Help needed climbing 3-5 steps with a railing? : A Little 6 Click Score: 21    End of Session Equipment Utilized During Treatment: Gait belt Activity Tolerance: Patient tolerated treatment well Patient left: in chair;with call bell/phone within reach;with chair alarm set(B heels floating via pillow) Nurse Communication: Mobility status;Precautions;Weight bearing status PT Visit Diagnosis: Unsteadiness on feet (R26.81);Muscle weakness (generalized) (M62.81);Difficulty in walking, not elsewhere classified (R26.2);Pain Pain - Right/Left: Left Pain - part of body:  Hip     Time: [id number] PT Time Calculation (min) (ACUTE ONLY): 43 min  Charges:  $Gait Training: 8-22 mins $Therapeutic Exercise: 8-22 mins $Therapeutic Activity: 8-22 mins                    Hendricks LimesEmily Jinelle Butchko, PT 10/29/18, 9:41 AM [phone]

## 2018-10-29 NOTE — TOC Transition Note (Signed)
Transition of Care [facility]) - CM/SW Discharge Note   Patient Details  Name: [name] MRN: [id number] Date of Birth: [birth year]  Transition of [facility]) CM/SW Contact:  Paizleigh Wilds, Lenice Llamas Phone Number: [phone]  10/29/2018, 12:47 PM   Clinical Narrative: Per RN patient can't afford the $90 Lovenox with the good Rx card so MD is going to put him on Eliquis. Clinical Education officer, museum (CSW) gave patient an Eliquis coupon which should make the co-pay $10. Patient reported that he can afford the $10 co-pay. Rolling walker has been delivered to patient's room. Clinical Education officer, museum (CSW) notified Helene Kelp [facility] home health representative that patient will D/C home today. Please reconsult if future social work needs arise. CSW signing off.     Final next level of care: [place] Barriers to Discharge: Barriers Resolved   Patient Goals and CMS Choice Patient states their goals for this hospitalization and ongoing recovery are:: To improve mobility CMS Medicare.gov Compare Post Acute Care list provided to:: Patient Choice offered to / list presented to : Patient  Discharge Placement                       Discharge Plan and Services In-house Referral: Clinical Social Work Discharge Planning Services: CM Consult Post Acute Care Choice: Durable Medical Equipment, Home Health          DME Arranged: Walker rolling DME Agency: [employer] Date DME Agency Contacted: 10/28/18 Time DME Agency Contacted: 28 Representative spoke with at DME Agency: [place]: PT [place]: [facility]formerly [employer] Date [place]: 10/27/18 Time [place]: 1700 Representative spoke with at [place]: [place] ([place]) Interventions     Readmission Risk Interventions No flowsheet data found.

## 2018-10-31 LAB — SURGICAL PATHOLOGY

## 2018-11-20 NOTE — Discharge Summary (Signed)
Physician Discharge Summary  Patient ID: Hayden Rush MRN: [id number] DOB/AGE: 1950-11-25 67 y.o.  Admit date: 10/27/2018 Discharge date: 10/29/2018 Admission Diagnoses:  PRIMARY LOCALIZED OSTEOARTHRITIS OF LEFT HIP   Discharge Diagnoses: Patient Active Problem List   Diagnosis Date Noted  . Status post total hip replacement, left 10/27/2018    Past Medical History:  Diagnosis Date  . Arthritis   . Depression      Transfusion: none   Consultants (if any):   Discharged Condition: Improved  Hospital Course: Hayden Rush is an 68 y.o. male who was admitted 10/27/2018 with a diagnosis of left hip osteoarthritis and went to the operating room on 10/27/2018 and underwent the above named procedures.    Surgeries: Procedure(s): TOTAL HIP ARTHROPLASTY ANTERIOR APPROACH on 10/27/2018 Patient tolerated the surgery well. Taken to PACU where she was stabilized and then transferred to the orthopedic floor.  Started on Lovenox 40 mg  q 24 hrs. Foot pumps applied bilaterally at 80 mm. Heels elevated on bed with rolled towels. No evidence of DVT. Negative [place]. Physical therapy started on day #1 for gait training and transfer. OT started day #1 for ADL and assisted devices.  Patient's foley was d/c on day #1. Patient's IV was d/c on day #2.  On post op day #2 patient was stable and ready for discharge to home with HHPT.  Implants: Medacta AMIS 3 lateralized stem,Mpact DM 54 mm cup and liner with L 28 mm metal head  He was given perioperative antibiotics:  Anti-infectives (From admission, onward)   Start     Dose/Rate Route Frequency Ordered Stop   10/27/18 1400  ceFAZolin (ANCEF) IVPB 2g/100 mL premix     2 g 200 mL/hr over 30 Minutes Intravenous Every 6 hours 10/27/18 1113 10/27/18 2115   10/27/18 0613  ceFAZolin (ANCEF) 2-4 GM/100ML-% IVPB    Note to Pharmacy: Norton Blizzard  : cabinet override      10/27/18 0613 10/27/18 0808   10/27/18 0100  ceFAZolin (ANCEF) IVPB 2g/100 mL  premix     2 g 200 mL/hr over 30 Minutes Intravenous  Once 10/27/18 0051 10/27/18 0808    .  He was given sequential compression devices, early ambulation, and Lovenox TEDs for DVT prophylaxis.  He benefited maximally from the hospital stay and there were no complications.    Recent vital signs:  Vitals:   10/29/18 0731 10/29/18 1207  BP: 133/82 (!) 152/86  Pulse: 92 93  Resp: 16 18  Temp: 98.1 F (36.7 C) 98.8 F (37.1 C)  SpO2: 97% 96%    Recent laboratory studies:  Lab Results  Component Value Date   HGB 14.5 10/28/2018   HGB 14.2 10/27/2018   HGB 15.6 10/17/2018   Lab Results  Component Value Date   WBC 8.0 10/28/2018   PLT 186 10/28/2018   Lab Results  Component Value Date   INR 1.1 10/17/2018   Lab Results  Component Value Date   NA 138 10/28/2018   K 4.0 10/28/2018   CL 106 10/28/2018   CO2 24 10/28/2018   BUN 9 10/28/2018   CREATININE 0.82 10/28/2018   GLUCOSE 144 (H) 10/28/2018    Discharge Medications:   Allergies as of 10/29/2018   No Known Allergies     Medication List    STOP taking these medications   diclofenac sodium 1 % Gel Commonly known as: VOLTAREN   ibuprofen 200 MG tablet Commonly known as: ADVIL     TAKE these  medications   apixaban 5 MG Tabs tablet Commonly known as: Eliquis Take 1 tablet (5 mg total) by mouth daily. Notes to patient: None given in hospital   HYDROcodone-acetaminophen 5-325 MG tablet Commonly known as: NORCO/VICODIN Take 1-2 tablets by mouth every 4 (four) hours as needed for moderate pain (pain score 4-6).   traMADol 50 MG tablet Commonly known as: ULTRAM Take 1 tablet (50 mg total) by mouth every 6 (six) hours.       Diagnostic Studies: Dg Hip Operative Unilat W Or W/o Pelvis Left  Result Date: 10/27/2018 CLINICAL DATA:  Operative imaging for left hip arthroplasty EXAM: OPERATIVE LEFT HIP (WITH PELVIS IF PERFORMED) 1 VIEWS TECHNIQUE: Fluoroscopic spot image(s) were submitted for interpretation  post-operatively. COMPARISON:  None. FINDINGS: Single portable fluoroscopic image shows a total left hip arthroplasty that appears well seated and aligned. The inferior aspect of the femoral component is not included on the field of view. IMPRESSION: Single portable image shows a well positioned total left hip arthroplasty. Electronically Signed   By: Amie Portlandavid  Ormond M.D.   On: 10/27/2018 10:38   Dg Hip Unilat W Or W/o Pelvis 2-3 Views Left  Result Date: 10/27/2018 CLINICAL DATA:  Status post left hip arthroplasty. EXAM: DG HIP (WITH OR WITHOUT PELVIS) 2-3V LEFT COMPARISON:  None. FINDINGS: New left total hip arthroplasty appears well seated and aligned. No acute fracture or evidence of an operative complication. IMPRESSION: Well-positioned left total hip arthroplasty. Electronically Signed   By: Amie Portlandavid  Ormond M.D.   On: 10/27/2018 10:37    Disposition:     Follow-up Information    Evon SlackGaines, Thomas C, PA-C. Schedule an appointment as soon as possible for a visit in 2 weeks.   Specialties: Orthopedic Surgery, Emergency Medicine Why: For staple removal....... Patient will need to make follow up appointments. Contact information: [address] [place] [address] [phone]            Signed: Patience MuscaGAINES, THOMAS CHRISTOPHER 11/20/2018, 9:36 AM

## 2020-05-29 ENCOUNTER — Encounter: Payer: Self-pay | Admitting: Cardiology

## 2020-06-06 ENCOUNTER — Other Ambulatory Visit: Payer: Self-pay

## 2020-06-06 ENCOUNTER — Ambulatory Visit: Payer: Medicare Other | Admitting: Surgery

## 2020-06-20 ENCOUNTER — Ambulatory Visit (INDEPENDENT_AMBULATORY_CARE_PROVIDER_SITE_OTHER): Payer: Medicare Other | Admitting: Cardiology

## 2020-06-20 ENCOUNTER — Encounter: Payer: Self-pay | Admitting: Cardiology

## 2020-06-20 ENCOUNTER — Other Ambulatory Visit: Payer: Self-pay

## 2020-06-20 VITALS — BP 136/90 | HR 79 | Ht 66.0 in | Wt 219.5 lb

## 2020-06-20 DIAGNOSIS — R011 Cardiac murmur, unspecified: Secondary | ICD-10-CM

## 2020-06-20 NOTE — Patient Instructions (Signed)

## 2020-06-20 NOTE — Progress Notes (Signed)
Cardiology Office Note:    Date:  06/20/2020   ID:  Hayden Rush, Hayden Rush January 05, 1951, MRN [id number]  PCP:  Evie Lacks, NP  [facility] Cardiologist:  No primary care provider on file.  [facility] Electrophysiologist:  None   Referring MD: Gildardo Pounds, PA   Chief Complaint  Patient presents with  . Other    Heart Murmur no complaints today. Meds reviewed verbally with pt.   Hayden Rush is a 70 y.o. male who is being seen today for the evaluation of cardiac murmur at the request of Gildardo Pounds, PA.   History of Present Illness:    Hayden Rush is a 70 y.o. male with a hx of Arthritis, former smoker x15 years who presents due to a cardiac murmur.  Patient was told 2 years ago that he had a cardiac murmur which was described as faint after a physical exam.  He saw a new primary care provider a month ago who also told patient he has a cardiac murmur and needs to see cardiology.  He denies any symptoms of chest pain, shortness of breath, palpitations, edema.  His dad passed from an MI in his 76s.  Past Medical History:  Diagnosis Date  . Arthritis   . Depression   . Heart murmur     Past Surgical History:  Procedure Laterality Date  . NO PAST SURGERIES    . 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     Allergies:   Patient has no known allergies.   Social History   Socioeconomic History  . Marital status: Single    Spouse name: Not on file  . Number of children: Not on file  . Years of education: Not on file  . Highest education level: Not on file  Occupational History  . Not on file  Tobacco Use  . Smoking status: Former Smoker    Quit date: 1986    Years  since quitting: 36.1  . Smokeless tobacco: Never Used  Vaping Use  . Vaping Use: Never used  Substance and Sexual Activity  . Alcohol use: Yes    Comment: occasional  . Drug use: Never  . Sexual activity: Not on file  Other Topics Concern  . Not on file  Social History Narrative  . Not on file   Social Determinants of Health   Financial Resource Strain: Not on file  Food Insecurity: Not on file  Transportation Needs: Not on file  Physical Activity: Not on file  Stress: Not on file  Social Connections: Not on file     Family History: The patient's family history includes Heart attack in his father.  ROS:   Please see the history of present illness.     All other systems reviewed and are negative.  EKGs/Labs/Other Studies Reviewed:    The following studies were reviewed today:   EKG:  EKG is  ordered today.  The ekg ordered today demonstrates normal sinus rhythm, normal ECG.  Recent Labs: No results found for requested labs within last 8760 hours.  Recent Lipid Panel No results found for: CHOL, TRIG, HDL, CHOLHDL, VLDL, LDLCALC, LDLDIRECT   Risk Assessment/Calculations:      Physical Exam:  VS:  BP 136/90 (BP Location: Right Arm, Patient Position: Sitting, Cuff Size: Normal)   Pulse 79   Ht 5\' 6"  (1.676 m)   Wt 219 lb 8 oz (99.6 kg)   SpO2 98%   BMI 35.43 kg/m     Wt Readings from Last 3 Encounters:  06/20/20 219 lb 8 oz (99.6 kg)  10/17/18 210 lb 9 oz (95.5 kg)     GEN:  Well nourished, well developed in no acute distress HEENT: Normal NECK: No JVD; No carotid bruits LYMPHATICS: No lymphadenopathy CARDIAC: RRR, 2/6 systolic murmur, loudest at apex RESPIRATORY: Decreased breath sounds at left lower base ABDOMEN: Soft, non-tender, non-distended MUSCULOSKELETAL:  No edema; No deformity  SKIN: Warm and dry NEUROLOGIC:  Alert and oriented x 3 PSYCHIATRIC:  Normal affect   ASSESSMENT:    1. Systolic murmur    PLAN:    In order of problems  listed above:  1. Patient with systolic murmur loudest at apex.  Could be MR versus AS.  Get echocardiogram to evaluate any valvular/structural pathology.  Patient is otherwise asymptomatic.     Follow-up after echocardiogram.     Medication Adjustments/Labs and Tests Ordered: Current medicines are reviewed at length with the patient today.  Concerns regarding medicines are outlined above.  Orders Placed This Encounter  Procedures  . EKG 12-Lead  . ECHOCARDIOGRAM COMPLETE   No orders of the defined types were placed in this encounter.   Patient Instructions  Medication Instructions:   Your physician recommends that you continue on your current medications as directed. Please refer to the Current Medication list given to you today.  *If you need a refill on your cardiac medications before your next appointment, please call your pharmacy*   Lab Work: None ordered If you have labs (blood work) drawn today and your tests are completely normal, you will receive your results only by: 12/17/18 MyChart Message (if you have MyChart) OR . A paper copy in the mail If you have any lab test that is abnormal or we need to change your treatment, we will call you to review the results.   Testing/Procedures:  1.  Your physician has requested that you have an echocardiogram. Echocardiography is a painless test that uses sound waves to create images of your heart. It provides your doctor with information about the size and shape of your heart and how well your heart's chambers and valves are working. This procedure takes approximately one hour. There are no restrictions for this procedure.    Follow-Up: At [facility], you and your health needs are our priority.  As part of our continuing mission to provide you with exceptional heart care, we have created designated Provider Care Teams.  These Care Teams include your primary Cardiologist (physician) and Advanced Practice Providers (APPs -   Physician Assistants and Nurse Practitioners) who all work together to provide you with the care you need, when you need it.  We recommend signing up for the patient portal called "MyChart".  Sign up information is provided on this After Visit Summary.  MyChart is used to connect with patients for Virtual Visits (Telemedicine).  Patients are able to view lab/test results, encounter notes, upcoming appointments, etc.  Non-urgent messages can be sent to your provider as well.   To learn more about what you can do with MyChart, go to [facility].    Your next appointment:   Follow up after echo   The format for your next appointment:  In Person  Provider:   Debbe Odea, MD   Other Instructions      Signed, Debbe Odea, MD  06/20/2020 12:58 PM    [facility]

## 2020-06-22 IMAGING — DX DG HIP (WITH OR WITHOUT PELVIS) 2-3V LEFT
2 series · 2 of 2 positions shown · non-contrast
Comparison: None.

CLINICAL DATA: Status post left hip arthroplasty.

EXAM:
DG HIP (WITH OR WITHOUT PELVIS) 2-3V LEFT

[pelvis ap]
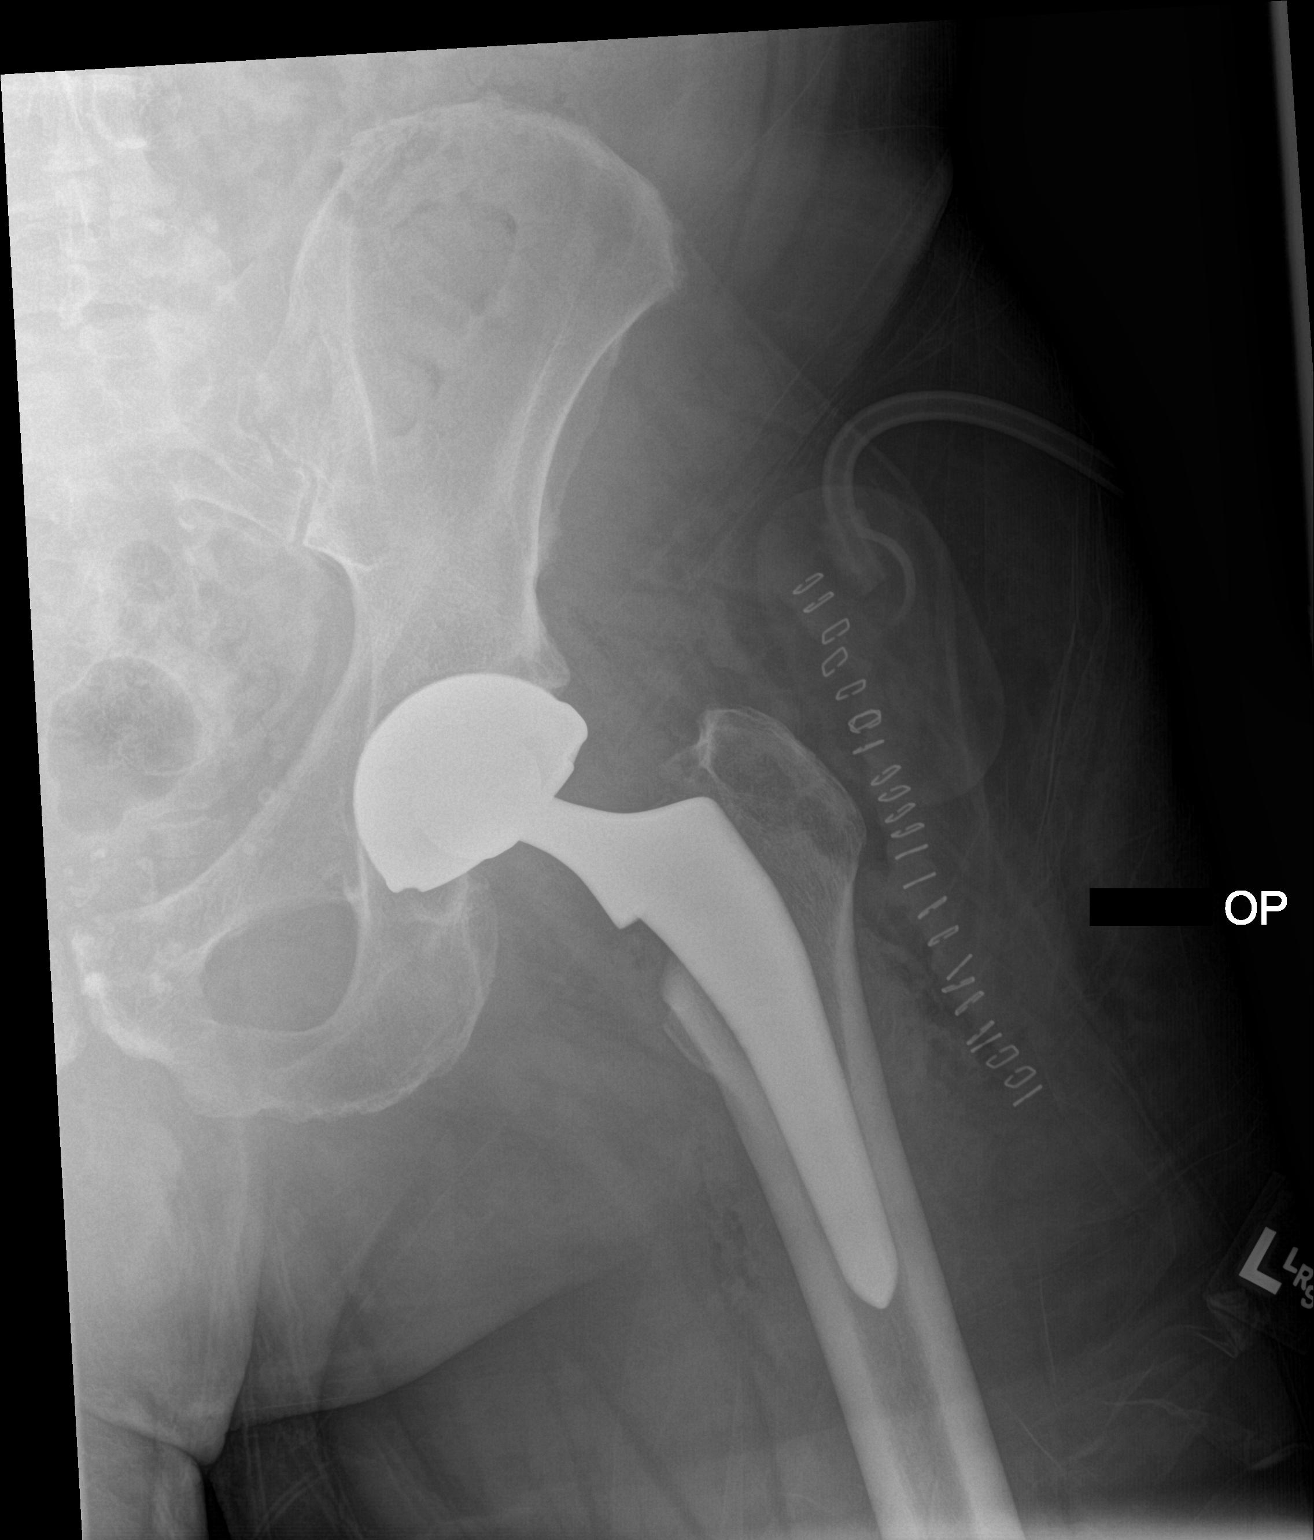

[hip lat]
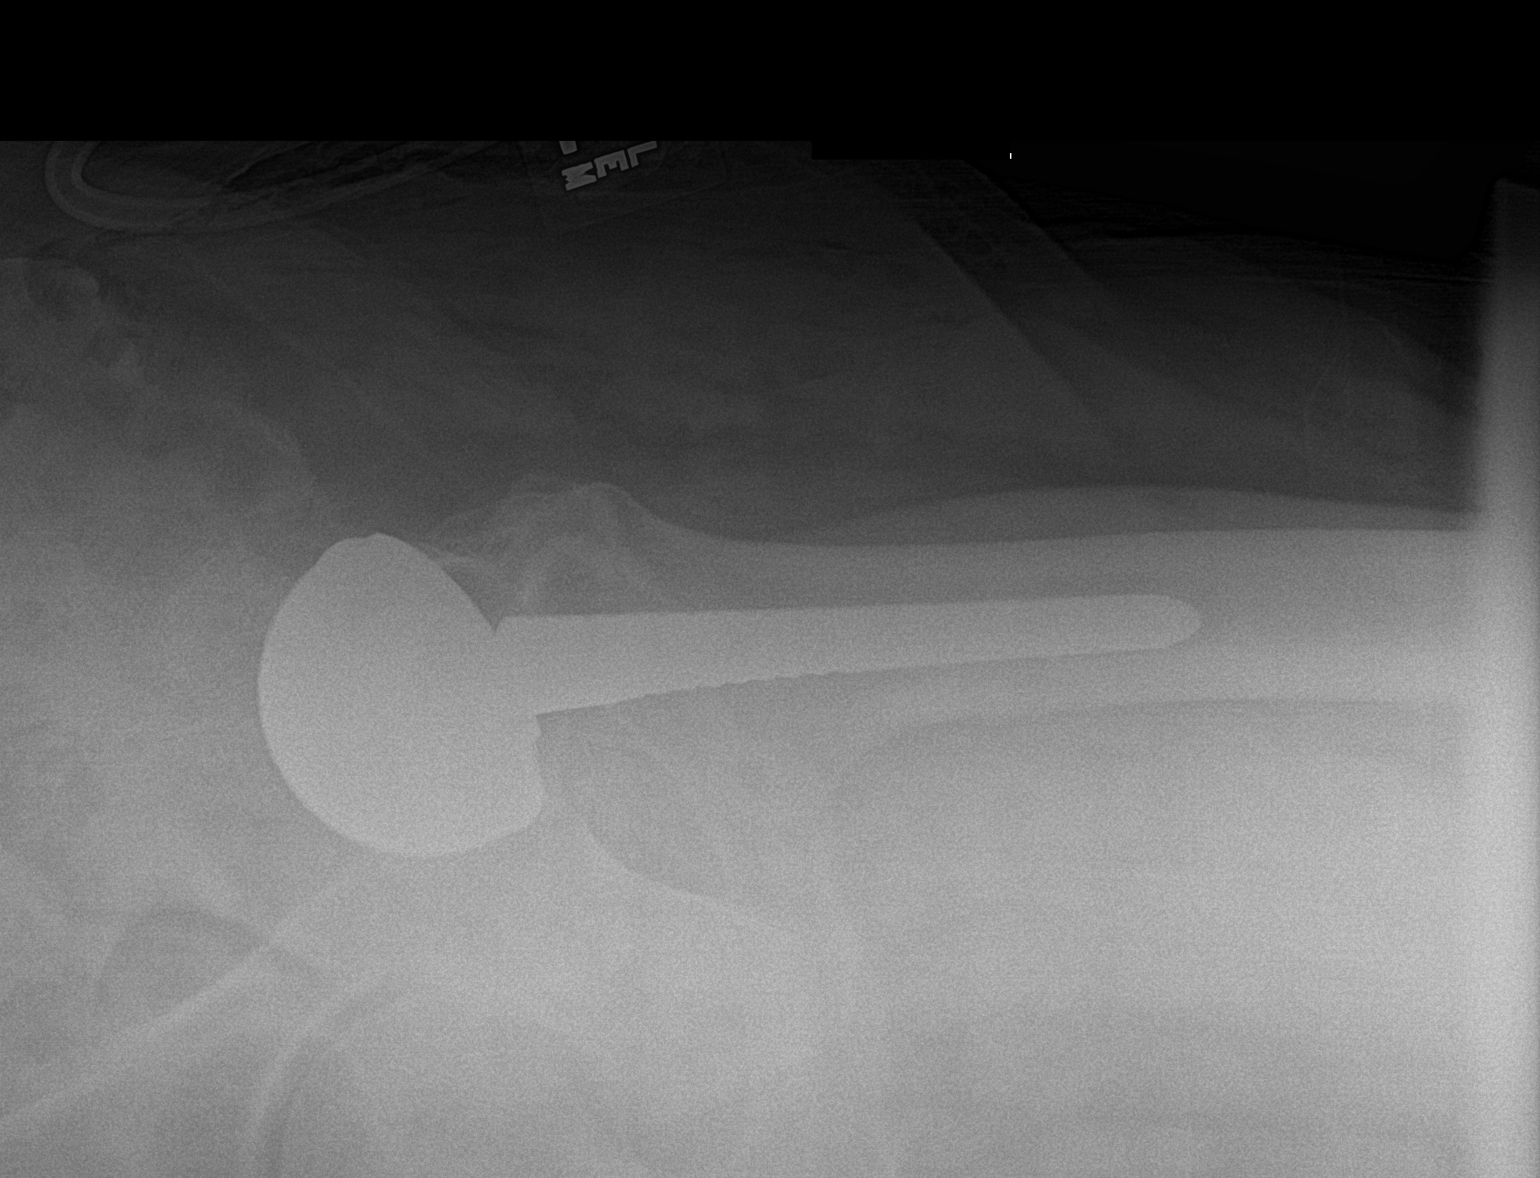

[2 of 2 positions shown; findings below may reference images not displayed]

FINDINGS: New left total hip arthroplasty appears well seated and aligned. No
acute fracture or evidence of an operative complication.
IMPRESSION: Well-positioned left total hip arthroplasty.

## 2020-06-22 IMAGING — XA OPERATIVE LEFT HIP WITH PELVIS
1 series · 1 of 1 positions shown · non-contrast
Comparison: None.

CLINICAL DATA: Operative imaging for left hip arthroplasty

EXAM:
OPERATIVE LEFT HIP (WITH PELVIS IF PERFORMED) 1 VIEWS
TECHNIQUE: Fluoroscopic spot image(s) were submitted for interpretation
post-operatively.

[Series 7: ortho standard · 1 of 1 slices shown]
[im 1/1]
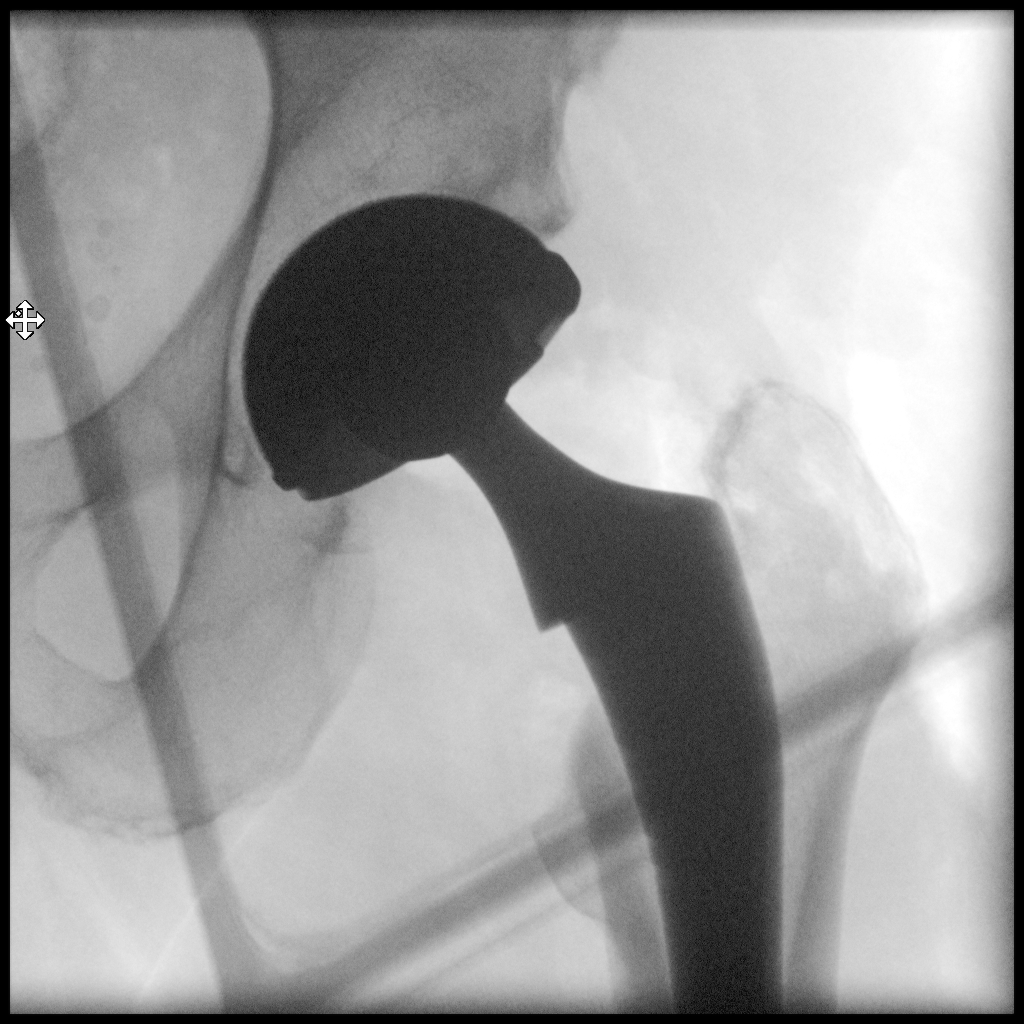

[1 of 1 positions shown; findings below may reference images not displayed]

FINDINGS: Single portable fluoroscopic image shows a total left hip
arthroplasty that appears well seated and aligned. The inferior
aspect of the femoral component is not included on the field of
view.
IMPRESSION: Single portable image shows a well positioned total left hip
arthroplasty.

## 2020-07-01 ENCOUNTER — Other Ambulatory Visit: Payer: Medicare Other

## 2020-07-03 ENCOUNTER — Ambulatory Visit (INDEPENDENT_AMBULATORY_CARE_PROVIDER_SITE_OTHER): Payer: Medicare Other

## 2020-07-03 ENCOUNTER — Other Ambulatory Visit: Payer: Self-pay

## 2020-07-03 DIAGNOSIS — R011 Cardiac murmur, unspecified: Secondary | ICD-10-CM | POA: Diagnosis not present

## 2020-07-03 LAB — ECHOCARDIOGRAM COMPLETE
AR max vel: 1.31 cm2
AV Area VTI: 1.52 cm2
AV Area mean vel: 1.2 cm2
AV Mean grad: 18.3 mmHg
AV Peak grad: 30.5 mmHg
Ao pk vel: 2.76 m/s
Area-P 1/2: 3.72 cm2
S' Lateral: 2.9 cm
Single Plane A4C EF: 54.1 %

## 2020-07-03 MED ORDER — PERFLUTREN LIPID MICROSPHERE
1.0000 mL | INTRAVENOUS | Status: AC | PRN
  Administered 2020-07-03: 2 mL via INTRAVENOUS

## 2020-07-08 ENCOUNTER — Encounter: Payer: Self-pay | Admitting: Cardiology

## 2020-07-08 ENCOUNTER — Ambulatory Visit: Payer: Medicare Other | Admitting: Cardiology

## 2020-07-08 ENCOUNTER — Other Ambulatory Visit: Payer: Self-pay

## 2020-07-08 VITALS — BP 130/90 | HR 78 | Ht 66.0 in | Wt 224.5 lb

## 2020-07-08 DIAGNOSIS — I77810 Thoracic aortic ectasia: Secondary | ICD-10-CM | POA: Diagnosis not present

## 2020-07-08 DIAGNOSIS — I350 Nonrheumatic aortic (valve) stenosis: Secondary | ICD-10-CM | POA: Diagnosis not present

## 2020-07-08 NOTE — Progress Notes (Signed)
Cardiology Office Note:    Date:  07/08/2020   ID:  Rieley, Hausman 06-10-1950, MRN [id number]  PCP:  Gildardo Pounds, PA  [facility] Cardiologist:  Debbe Odea, MD  [facility] Electrophysiologist:  None   Referring MD: Evie Lacks, NP   Chief Complaint  Patient presents with  . Other    F/u echo. Meds reviewed verbally with pt.    History of Present Illness:    Hayden Rush is a 70 y.o. male with a hx of Arthritis, former smoker x15 years who presents for follow-up.  He was last seen due to a cardiac murmur.  Echocardiogram was ordered to evaluate any significant structural abnormalities.  He has no cardiac symptoms such as chest pain, shortness of breath, edema or palpitations.  He feels well otherwise.  He is getting back to exercising.   Past Medical History:  Diagnosis Date  . Arthritis   . Depression   . Heart murmur     Past Surgical History:  Procedure Laterality Date  . NO PAST SURGERIES    . 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     Allergies:   Patient has no known allergies.   Social History   Socioeconomic History  . Marital status: Single    Spouse name: Not on file  . Number of children: Not on file  . Years of education: Not on file  . Highest education level: Not on file  Occupational History  . Not on file  Tobacco Use  . Smoking status: Former Smoker    Quit date: 1986    Years since quitting: 36.1  . Smokeless tobacco: Never Used  Vaping Use  . Vaping Use: Never used  Substance and Sexual Activity  . Alcohol use: Yes    Comment: occasional  . Drug use: Never  . Sexual activity: Not on file  Other Topics Concern  . Not  on file  Social History Narrative  . Not on file   Social Determinants of Health   Financial Resource Strain: Not on file  Food Insecurity: Not on file  Transportation Needs: Not on file  Physical Activity: Not on file  Stress: Not on file  Social Connections: Not on file     Family History: The patient's family history includes Heart attack in his father.  ROS:   Please see the history of present illness.     All other systems reviewed and are negative.  EKGs/Labs/Other Studies Reviewed:    The following studies were reviewed today:   EKG:  EKG is  ordered today.  The ekg ordered today demonstrates normal sinus rhythm, normal ECG.  Recent Labs: No results found for requested labs within last 8760 hours.  Recent Lipid Panel No results found for: CHOL, TRIG, HDL, CHOLHDL, VLDL, LDLCALC, LDLDIRECT   Risk Assessment/Calculations:      Physical Exam:    VS:  BP 130/90 (BP Location: Left Arm, Patient Position: Sitting, Cuff Size: Normal)   Pulse 78   Ht 5\' 6"  (1.676 m)   Wt 224 lb 8 oz (101.8 kg)   SpO2 98%   BMI 36.24 kg/m     Wt Readings from Last 3 Encounters:  07/08/20 224 lb  8 oz (101.8 kg)  06/20/20 219 lb 8 oz (99.6 kg)  10/17/18 210 lb 9 oz (95.5 kg)     GEN:  Well nourished, well developed in no acute distress HEENT: Normal NECK: No JVD; No carotid bruits LYMPHATICS: No lymphadenopathy CARDIAC: RRR, 2/6 systolic murmur, loudest at apex RESPIRATORY: Decreased breath sounds at left lower base ABDOMEN: Soft, non-tender, non-distended MUSCULOSKELETAL:  No edema; No deformity  SKIN: Warm and dry NEUROLOGIC:  Alert and oriented x 3 PSYCHIATRIC:  Normal affect   ASSESSMENT:    1. Aortic valve stenosis, etiology of cardiac valve disease unspecified   2. Aortic root dilation (HCC)    PLAN:    In order of problems listed above:  1. Mild aortic valve stenosis noted on echocardiogram dated 06/2020.  Plan for monitoring with serial  echocardiograms. 2. Mild aortic root dilatation measuring 40 mm noted on echocardiogram.  Plan for repeat echocardiogram in 1 year to make sure of stability.  If stays stable, can increase timing of serial monitoring with echo every 2 to 3 years.  Follow-up in 1 year after echocardiogram.   Medication Adjustments/Labs and Tests Ordered: Current medicines are reviewed at length with the patient today.  Concerns regarding medicines are outlined above.  Orders Placed This Encounter  Procedures  . ECHOCARDIOGRAM COMPLETE   No orders of the defined types were placed in this encounter.   Patient Instructions  Medication Instructions:  Your physician recommends that you continue on your current medications as directed. Please refer to the Current Medication list given to you today.  *If you need a refill on your cardiac medications before your next appointment, please call your pharmacy*   Lab Work: None ordered If you have labs (blood work) drawn today and your tests are completely normal, you will receive your results only by: Marland Kitchen MyChart Message (if you have MyChart) OR . A paper copy in the mail If you have any lab test that is abnormal or we need to change your treatment, we will call you to review the results.   Testing/Procedures:  1.  Your physician has requested that you have an echocardiogram in 1 year. Echocardiography is a painless test that uses sound waves to create images of your heart. It provides your doctor with information about the size and shape of your heart and how well your heart's chambers and valves are working. This procedure takes approximately one hour. There are no restrictions for this procedure.    Follow-Up: At [facility], you and your health needs are our priority.  As part of our continuing mission to provide you with exceptional heart care, we have created designated Provider Care Teams.  These Care Teams include your primary Cardiologist  (physician) and Advanced Practice Providers (APPs -  Physician Assistants and Nurse Practitioners) who all work together to provide you with the care you need, when you need it.  We recommend signing up for the patient portal called "MyChart".  Sign up information is provided on this After Visit Summary.  MyChart is used to connect with patients for Virtual Visits (Telemedicine).  Patients are able to view lab/test results, encounter notes, upcoming appointments, etc.  Non-urgent messages can be sent to your provider as well.   To learn more about what you can do with MyChart, go to [email or web address].    Your next appointment:   1 year(s)  The format for your next appointment:   In Person  Provider:   Debbe Odea, MD  Other Instructions      Signed, Debbe Odea, MD  07/08/2020 11:40 AM    [facility]

## 2020-07-08 NOTE — Patient Instructions (Signed)
Medication Instructions:  Your physician recommends that you continue on your current medications as directed. Please refer to the Current Medication list given to you today.  *If you need a refill on your cardiac medications before your next appointment, please call your pharmacy*   Lab Work: None ordered If you have labs (blood work) drawn today and your tests are completely normal, you will receive your results only by: . MyChart Message (if you have MyChart) OR . A paper copy in the mail If you have any lab test that is abnormal or we need to change your treatment, we will call you to review the results.   Testing/Procedures:  1.  Your physician has requested that you have an echocardiogram in 1 year. Echocardiography is a painless test that uses sound waves to create images of your heart. It provides your doctor with information about the size and shape of your heart and how well your heart's chambers and valves are working. This procedure takes approximately one hour. There are no restrictions for this procedure.     Follow-Up: At [facility], you and your health needs are our priority.  As part of our continuing mission to provide you with exceptional heart care, we have created designated Provider Care Teams.  These Care Teams include your primary Cardiologist (physician) and Advanced Practice Providers (APPs -  Physician Assistants and Nurse Practitioners) who all work together to provide you with the care you need, when you need it.  We recommend signing up for the patient portal called "MyChart".  Sign up information is provided on this After Visit Summary.  MyChart is used to connect with patients for Virtual Visits (Telemedicine).  Patients are able to view lab/test results, encounter notes, upcoming appointments, etc.  Non-urgent messages can be sent to your provider as well.   To learn more about what you can do with MyChart, go to [email or web address]://www.mychart.com.    Your next  appointment:   1 year(s)  The format for your next appointment:   In Person  Provider:   Brian Agbor-Etang, MD   Other Instructions   

## 2021-08-07 DIAGNOSIS — R059 Cough, unspecified: Secondary | ICD-10-CM | POA: Diagnosis not present

## 2021-08-07 DIAGNOSIS — Z0000 Encounter for general adult medical examination without abnormal findings: Secondary | ICD-10-CM | POA: Diagnosis not present

## 2021-08-07 DIAGNOSIS — R011 Cardiac murmur, unspecified: Secondary | ICD-10-CM | POA: Diagnosis not present

## 2021-08-07 DIAGNOSIS — J120 Adenoviral pneumonia: Secondary | ICD-10-CM | POA: Diagnosis not present

## 2021-11-14 ENCOUNTER — Other Ambulatory Visit: Payer: Self-pay

## 2021-11-14 DIAGNOSIS — I350 Nonrheumatic aortic (valve) stenosis: Secondary | ICD-10-CM

## 2021-11-14 NOTE — Progress Notes (Signed)
Order placed.   Gerringer, Aviva Signs, [place], RN Hello,  Will need echo order placed if needed.  Thank you        Previous Messages    ----- Message -----  From: Thayer Headings, Ruthell Rummage  Sent: 11/14/2021   7:36 AM EDT  To: Norman Herrlich  Subject: echo                                           It looks like patient may need to have an echo prior to his upcoming appt on 11/24/21 with Dr. Azucena Cecil.  Per 07/08/20 office note:  Your physician has requested that you have an echocardiogram in 1 year.  Thank you!

## 2021-11-24 ENCOUNTER — Ambulatory Visit: Payer: Medicare Other | Admitting: Cardiology

## 2021-12-04 ENCOUNTER — Ambulatory Visit (INDEPENDENT_AMBULATORY_CARE_PROVIDER_SITE_OTHER): Payer: Medicare Other

## 2021-12-04 DIAGNOSIS — Z1389 Encounter for screening for other disorder: Secondary | ICD-10-CM | POA: Diagnosis not present

## 2021-12-04 DIAGNOSIS — Z0000 Encounter for general adult medical examination without abnormal findings: Secondary | ICD-10-CM | POA: Diagnosis not present

## 2021-12-04 DIAGNOSIS — I350 Nonrheumatic aortic (valve) stenosis: Secondary | ICD-10-CM

## 2021-12-04 LAB — ECHOCARDIOGRAM COMPLETE
AR max vel: 0.87 cm2
AV Area VTI: 1.03 cm2
AV Area mean vel: 0.93 cm2
AV Mean grad: 23.7 mmHg
AV Peak grad: 43.6 mmHg
Ao pk vel: 3.3 m/s
Area-P 1/2: 3.74 cm2
S' Lateral: 3 cm

## 2021-12-12 ENCOUNTER — Encounter: Payer: Self-pay | Admitting: Cardiology

## 2021-12-12 ENCOUNTER — Ambulatory Visit: Payer: Medicare Other | Admitting: Cardiology

## 2021-12-12 VITALS — BP 118/76 | HR 72 | Wt 211.0 lb

## 2021-12-12 DIAGNOSIS — I77810 Thoracic aortic ectasia: Secondary | ICD-10-CM | POA: Diagnosis not present

## 2021-12-12 DIAGNOSIS — I350 Nonrheumatic aortic (valve) stenosis: Secondary | ICD-10-CM | POA: Diagnosis not present

## 2021-12-12 NOTE — Progress Notes (Signed)
Cardiology Office Note:    Date:  12/12/2021   ID:  Hayden Rush, Hayden Rush 07-28-1950, MRN [id number]  PCP:  Gildardo Pounds, PA  [facility] Cardiologist:  Debbe Odea, MD  [facility] Electrophysiologist:  None   Referring MD: Gildardo Pounds, PA   Chief Complaint  Patient presents with   OTher    12 month follow up. ECHO completed -- 12/04/2021. Meds reviewed verbally with patient.     History of Present Illness:    Hayden Rush is a 71 y.o. male with a hx of aortic valve stenosis, mild aortic root dilatation, former smoker x15 years who presents for follow-up.    Being seen for aortic valve stenosis and aortic root dilatation.  Previous echo showed mild aortic valve stenosis, heart repeat echocardiogram last month.  Denies chest pain or shortness of breath.  Denies dizziness or syncope.  Presents for echocardiogram results.  Prior notes Echo 2/22 EF 55 to 60%, mild aortic valve stenosis, mild aortic root dilatation measuring 40 mm.  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     Allergies:   Patient has no known allergies.   Social History   Socioeconomic History   Marital status: Single    Spouse name: Not on file   Number of children: Not on file   Years of education: Not on file   Highest education level: Not on file  Occupational History   Not on file  Tobacco Use   Smoking status: Former    Types: Cigarettes    Quit date: 31    Years since quitting: 37.6   Smokeless tobacco: Never  Vaping Use    Vaping Use: Never used  Substance and Sexual Activity   Alcohol use: Yes    Comment: occasional   Drug use: Never   Sexual activity: Not on file  Other Topics Concern   Not on file  Social History Narrative   Not on file   Social Determinants of Health   Financial Resource Strain: Not on file  Food Insecurity: Not on file  Transportation Needs: Not on file  Physical Activity: Not on file  Stress: Not on file  Social Connections: Not on file     Family History: The patient's family history includes Heart attack in his father.  ROS:   Please see the history of present illness.     All other systems reviewed and are negative.  EKGs/Labs/Other Studies Reviewed:    The following studies were reviewed today:   EKG:  EKG is  ordered today.  The ekg ordered today demonstrates normal sinus rhythm, normal ECG.  Recent Labs: No results found for requested labs within last 365 days.  Recent Lipid Panel No results found for: "CHOL", "TRIG", "HDL", "CHOLHDL", "VLDL", "LDLCALC", "LDLDIRECT"   Risk Assessment/Calculations:      Physical Exam:    VS:  BP 118/76 (BP Location: Left Arm, Patient Position: Sitting, Cuff Size: Normal)   Pulse 72   Wt 211 lb (95.7 kg)  SpO2 95%   BMI 34.06 kg/m     Wt Readings from Last 3 Encounters:  12/12/21 211 lb (95.7 kg)  07/08/20 224 lb 8 oz (101.8 kg)  06/20/20 219 lb 8 oz (99.6 kg)     GEN:  Well nourished, well developed in no acute distress HEENT: Normal NECK: No JVD; No carotid bruits CARDIAC: RRR, 2/6 systolic murmur, loudest at apex RESPIRATORY: Decreased breath sounds at left lower base ABDOMEN: Soft, non-tender, non-distended MUSCULOSKELETAL:  No edema; No deformity  SKIN: Warm and dry NEUROLOGIC:  Alert and oriented x 3 PSYCHIATRIC:  Normal affect   ASSESSMENT:    1. Aortic valve stenosis, etiology of cardiac valve disease unspecified   2. Aortic root dilation (HCC)    PLAN:    In order of problems listed  above:  Moderate aortic valve stenosis on recent echo, plan repeat echocardiogram in 6 months. Mild aortic root dilatation measuring 43 mm.  Monitor serially with echo as above when evaluating aortic valve stenosis.  Follow-up in 6 months after echocardiogram.   Medication Adjustments/Labs and Tests Ordered: Current medicines are reviewed at length with the patient today.  Concerns regarding medicines are outlined above.  Orders Placed This Encounter  Procedures   EKG 12-Lead   ECHOCARDIOGRAM COMPLETE   No orders of the defined types were placed in this encounter.   Patient Instructions  Medication Instructions:   Your physician recommends that you continue on your current medications as directed. Please refer to the Current Medication list given to you today.  *If you need a refill on your cardiac medications before your next appointment, please call your pharmacy*   Testing/Procedures:  Your physician has requested that you have an echocardiogram in 6 months. Echocardiography is a painless test that uses sound waves to create images of your heart. It provides your doctor with information about the size and shape of your heart and how well your heart's chambers and valves are working. This procedure takes approximately one hour. There are no restrictions for this procedure.    Follow-Up: At [facility], you and your health needs are our priority.  As part of our continuing mission to provide you with exceptional heart care, we have created designated Provider Care Teams.  These Care Teams include your primary Cardiologist (physician) and Advanced Practice Providers (APPs -  Physician Assistants and Nurse Practitioners) who all work together to provide you with the care you need, when you need it.  We recommend signing up for the patient portal called "MyChart".  Sign up information is provided on this After Visit Summary.  MyChart is used to connect with patients for Virtual  Visits (Telemedicine).  Patients are able to view lab/test results, encounter notes, upcoming appointments, etc.  Non-urgent messages can be sent to your provider as well.   To learn more about what you can do with MyChart, go to [email or web address].    Your next appointment:   6 month(s) after Echo  The format for your next appointment:   In Person  Provider:   You may see Debbe Odea, MD or one of the following Advanced Practice Providers on your designated Care Team:   Nicolasa Ducking, NP Eula Listen, PA-C Cadence Fransico Michael, [place]    Other Instructions   Important Information About Sugar         Signed, Debbe Odea, MD  12/12/2021 12:38 PM    [facility]

## 2021-12-12 NOTE — Patient Instructions (Signed)
Medication Instructions:   Your physician recommends that you continue on your current medications as directed. Please refer to the Current Medication list given to you today.  *If you need a refill on your cardiac medications before your next appointment, please call your pharmacy*   Testing/Procedures:  Your physician has requested that you have an echocardiogram in 6 months. Echocardiography is a painless test that uses sound waves to create images of your heart. It provides your doctor with information about the size and shape of your heart and how well your heart's chambers and valves are working. This procedure takes approximately one hour. There are no restrictions for this procedure.    Follow-Up: At [facility], you and your health needs are our priority.  As part of our continuing mission to provide you with exceptional heart care, we have created designated Provider Care Teams.  These Care Teams include your primary Cardiologist (physician) and Advanced Practice Providers (APPs -  Physician Assistants and Nurse Practitioners) who all work together to provide you with the care you need, when you need it.  We recommend signing up for the patient portal called "MyChart".  Sign up information is provided on this After Visit Summary.  MyChart is used to connect with patients for Virtual Visits (Telemedicine).  Patients are able to view lab/test results, encounter notes, upcoming appointments, etc.  Non-urgent messages can be sent to your provider as well.   To learn more about what you can do with MyChart, go to [email or web address].    Your next appointment:   6 month(s) after Echo  The format for your next appointment:   In Person  Provider:   You may see Debbe Odea, MD or one of the following Advanced Practice Providers on your designated Care Team:   Nicolasa Ducking, NP Eula Listen, PA-C Cadence Fransico Michael, [place]    Other Instructions   Important Information About  [place]

## 2022-11-05 ENCOUNTER — Encounter: Payer: Self-pay | Admitting: *Deleted

## 2022-11-13 ENCOUNTER — Ambulatory Visit: Payer: Medicare Other | Attending: Cardiology | Admitting: Cardiology

## 2022-11-16 ENCOUNTER — Encounter: Payer: Self-pay | Admitting: Cardiology
